# Patient Record
Sex: Male | Born: 1945
Health system: Southern US, Community
[De-identification: ages and names within clinical notes are randomized; demographics above are authoritative.]

## PROBLEM LIST (undated history)

## (undated) DIAGNOSIS — I10 Essential (primary) hypertension: Secondary | ICD-10-CM

## (undated) DIAGNOSIS — G709 Myoneural disorder, unspecified: Secondary | ICD-10-CM

## (undated) DIAGNOSIS — Z85850 Personal history of malignant neoplasm of thyroid: Secondary | ICD-10-CM

## (undated) DIAGNOSIS — C801 Malignant (primary) neoplasm, unspecified: Secondary | ICD-10-CM

## (undated) HISTORY — PX: VASECTOMY: SHX75

## (undated) HISTORY — PX: TOTAL THYROIDECTOMY: SHX2547

## (undated) HISTORY — PX: BRAIN SURGERY: SHX531

---

## 1999-06-16 ENCOUNTER — Ambulatory Visit (HOSPITAL_COMMUNITY): Admission: RE | Admit: 1999-06-16 | Discharge: 1999-06-16 | Payer: Self-pay | Admitting: Gastroenterology

## 1999-07-12 ENCOUNTER — Encounter: Admission: RE | Admit: 1999-07-12 | Discharge: 1999-07-12 | Payer: Self-pay | Admitting: Family Medicine

## 1999-07-12 ENCOUNTER — Encounter: Payer: Self-pay | Admitting: Family Medicine

## 2000-09-29 ENCOUNTER — Emergency Department (HOSPITAL_COMMUNITY): Admission: EM | Admit: 2000-09-29 | Discharge: 2000-09-29 | Payer: Self-pay | Admitting: Emergency Medicine

## 2000-09-29 ENCOUNTER — Encounter: Payer: Self-pay | Admitting: Emergency Medicine

## 2001-05-22 ENCOUNTER — Emergency Department (HOSPITAL_COMMUNITY): Admission: EM | Admit: 2001-05-22 | Discharge: 2001-05-22 | Payer: Self-pay | Admitting: Emergency Medicine

## 2001-05-22 ENCOUNTER — Encounter: Payer: Self-pay | Admitting: Emergency Medicine

## 2001-06-08 ENCOUNTER — Emergency Department (HOSPITAL_COMMUNITY): Admission: EM | Admit: 2001-06-08 | Discharge: 2001-06-08 | Payer: Self-pay | Admitting: Emergency Medicine

## 2005-06-21 ENCOUNTER — Encounter: Admission: RE | Admit: 2005-06-21 | Discharge: 2005-06-21 | Payer: Self-pay | Admitting: Family Medicine

## 2005-06-21 ENCOUNTER — Other Ambulatory Visit: Admission: RE | Admit: 2005-06-21 | Discharge: 2005-06-21 | Payer: Self-pay | Admitting: Interventional Radiology

## 2005-06-21 ENCOUNTER — Encounter (INDEPENDENT_AMBULATORY_CARE_PROVIDER_SITE_OTHER): Payer: Self-pay | Admitting: *Deleted

## 2005-08-03 ENCOUNTER — Ambulatory Visit (HOSPITAL_COMMUNITY): Admission: RE | Admit: 2005-08-03 | Discharge: 2005-08-04 | Payer: Self-pay | Admitting: Surgery

## 2005-08-03 ENCOUNTER — Encounter (INDEPENDENT_AMBULATORY_CARE_PROVIDER_SITE_OTHER): Payer: Self-pay | Admitting: Specialist

## 2005-09-01 ENCOUNTER — Encounter: Admission: RE | Admit: 2005-09-01 | Discharge: 2005-09-01 | Payer: Self-pay | Admitting: Endocrinology

## 2005-09-08 ENCOUNTER — Encounter: Admission: RE | Admit: 2005-09-08 | Discharge: 2005-09-08 | Payer: Self-pay | Admitting: Endocrinology

## 2005-09-15 ENCOUNTER — Encounter: Admission: RE | Admit: 2005-09-15 | Discharge: 2005-09-15 | Payer: Self-pay | Admitting: Endocrinology

## 2006-08-29 ENCOUNTER — Encounter: Admission: RE | Admit: 2006-08-29 | Discharge: 2006-08-29 | Payer: Self-pay | Admitting: Endocrinology

## 2007-03-20 ENCOUNTER — Encounter: Admission: RE | Admit: 2007-03-20 | Discharge: 2007-03-20 | Payer: Self-pay | Admitting: Neurosurgery

## 2007-04-01 ENCOUNTER — Inpatient Hospital Stay (HOSPITAL_COMMUNITY): Admission: RE | Admit: 2007-04-01 | Discharge: 2007-04-05 | Payer: Self-pay | Admitting: Neurosurgery

## 2007-10-07 ENCOUNTER — Encounter: Admission: RE | Admit: 2007-10-07 | Discharge: 2007-10-07 | Payer: Self-pay | Admitting: Endocrinology

## 2007-10-08 ENCOUNTER — Encounter: Admission: RE | Admit: 2007-10-08 | Discharge: 2007-10-08 | Payer: Self-pay | Admitting: Endocrinology

## 2007-10-09 ENCOUNTER — Encounter (HOSPITAL_COMMUNITY): Admission: RE | Admit: 2007-10-09 | Discharge: 2007-11-14 | Payer: Self-pay | Admitting: Endocrinology

## 2008-09-21 ENCOUNTER — Encounter (HOSPITAL_COMMUNITY): Admission: RE | Admit: 2008-09-21 | Discharge: 2008-11-19 | Payer: Self-pay | Admitting: Endocrinology

## 2009-07-15 ENCOUNTER — Ambulatory Visit (HOSPITAL_COMMUNITY): Admission: RE | Admit: 2009-07-15 | Discharge: 2009-07-15 | Payer: Self-pay | Admitting: Gastroenterology

## 2010-01-03 ENCOUNTER — Encounter (HOSPITAL_COMMUNITY)
Admission: RE | Admit: 2010-01-03 | Discharge: 2010-02-18 | Payer: Self-pay | Source: Home / Self Care | Attending: Endocrinology | Admitting: Endocrinology

## 2010-01-15 ENCOUNTER — Emergency Department (HOSPITAL_COMMUNITY)
Admission: EM | Admit: 2010-01-15 | Discharge: 2010-01-15 | Payer: Self-pay | Source: Home / Self Care | Admitting: Emergency Medicine

## 2010-03-13 ENCOUNTER — Encounter: Payer: Self-pay | Admitting: Endocrinology

## 2010-03-14 ENCOUNTER — Encounter: Payer: Self-pay | Admitting: Endocrinology

## 2010-07-05 NOTE — Discharge Summary (Signed)
NAME:  Jose Gross, [place] NO.:  [id number]   MEDICAL RECORD NO.:  [id number]          PATIENT TYPE:  INP   LOCATION:  3005                         FACILITY:  [facility]   PHYSICIAN:  Hewitt Shorts, M.D.DATE OF BIRTH:  06-05-1945   DATE OF ADMISSION:  04/01/2007  DATE OF DISCHARGE:  04/05/2007                               DISCHARGE SUMMARY   ADMISSION HISTORY:  The patient is a 65 year old man who presented with  left V1 trigeminal neuralgia.  He had had difficulties for 10 years, but  they had worsened recently and he was having persistent difficulties  with disabling facial pain despite maximum doses of gabapentin and  Tegretol which was leading to somnolence and a decision was made for  microvascular decompression.   PHYSICAL EXAMINATION:  General examination was unremarkable.  Neurologic  examination was intact.   HOSPITAL COURSE:  The patient was admitted and underwent  a left  retromastoid craniectomy and microvascular decompression of the  trigeminal nerve.  We found that an artery on the ventral surface of the  trigeminal nerve root entry zone compressing and pulsating against the  nerve.  We were able to free the artery up by opening up its arachnoid  adhesions and mobilizing it further anteriorly and placing Teflon felt  between the nerve and artery.  Surgery itself went quite well.   Postoperatively in the ICU, we tapered his Tegretol and Neurontin.  He  had no facial pain postoperatively with complete relief of the  trigeminal neuralgia.  His incision has healed nicely.  He has been  afebrile.  He had some mild nausea for the first day or two which is  eased.  He has some mild anorexia but no nausea at this time.  He has  mild incisional discomfort but no facial pain.  His wound is healing  nicely.  He is afebrile.  He is up and ambulating in the hall, and he  feels that he is ready for discharge.   He was treated with Decadron perioperatively and we  have tapered the  Decadron and we are discharging him to home with a prescription for low-  dose Decadron.  We have prescribed 0.75 mg tablets.  He is to take 1  b.i.d. for 2 days, then 1 daily for 2 days, and then 1 every other day  for 2 doses.  We prescribed 8 tablets and no refills.  The patient  declined a prescription for pain medication.  He feels that he will be  able to get by with just Tylenol.  I did tell him and his wife that they  could use Advil  or Aleve if needed.  He is to return in 3 days for  staple removal and to they are to contact us if there is any difficulty  that were to develop.   DISCHARGE DIAGNOSIS:  Trigeminal neuralgia.      Hewitt Shorts, M.D.  Electronically Signed     RWN/MEDQ  D:  04/05/2007  T:  04/05/2007  Job:  [id number]

## 2010-07-05 NOTE — Op Note (Signed)
NAME:  Jose Gross, Jose Gross NO.:  [id number]   MEDICAL RECORD NO.:  [id number]          PATIENT TYPE:  INP   LOCATION:  3111                         FACILITY:  [facility]   PHYSICIAN:  Hewitt Shorts, M.D.DATE OF BIRTH:  1945/12/31   DATE OF PROCEDURE:  04/01/2007  DATE OF DISCHARGE:                               OPERATIVE REPORT   PREOPERATIVE DIAGNOSIS:  Left V1 trigeminal neuralgia.   POSTOPERATIVE DIAGNOSIS:  Left V1 trigeminal neuralgia.   PROCEDURE PERFORMED:  Left retromastoid craniectomy and microvascular  decompression of the left trigeminal nerve with microdissection.   SURGEON:  Hewitt Shorts, MD.   ASSISTANT:  Leonel Ramsay   ANESTHESIA:  General endotracheal.   INDICATIONS:  The patient is a 65 year old man who is here with left V1  trigeminal neuralgia.  The patient treated for the past 10 years but has  worsening over the past couple of months requiring large amounts of two  medications with increasing side effects, particularly of drowsiness and  grogginess.  The patient was therefore considered for surgical  intervention.  He is admitted now for such.   PROCEDURE:  The patient was brought to the operating room and placed  under general endotracheal anesthesia.  The patient was placed on a  three-pin Mayfield headholder.  The patient was turned to three-quarter  park bench position.  The left retromastoid region was shaved and then  prepped with Betadine soap and solution and draped in sterile fashion.  An approximately 9 cm length incision was made behind the left ear.  The  midline incision was infiltrated with local anesthetic with epinephrine.  Incision was made and carried down to the subcutaneous tissue.  Bipolar  cautery and electrocautery used to maintain hemostasis.  Dissection was  carried down to the skull and the nuchal muscles were elevated off of  the occipital bone.  A self-retaining retractor was placed.  Bridging  emissary  veins between the skull and the scalp was packed with either  Gelfoam or wax as needed, and we exposed the left retromastoid region.  Then, with loop magnification, craniectomy was performed using the XMax  drill and Kerrison punches.  The dura was identified and we gradually  proceeded with the craniectomy in a rostral and lateral trajectory until  we reached the transverse sinus superiorly and the genu of the sinus  into the sigmoid sinus laterally.  The edges of the bone were waxed as  needed.  We had moderate bleeding from large venous structure within the  bone in the retromastoid area, and this was waxed and packed with  Gelfoam as needed, and good hemostasis was established.  We then opened  the dura in a triangular fashion with flaps hinged towards the  transverse sinuses superiorly and the sigmoid sinus laterally.  The  arachnoid was opened and CSF drainage was allowed to occur.  Good CSF  drainage was encountered and as the CSF drained, the cerebellum began to  fall away and we were able to set up the Apfelbaum retractor and we were  able to dissect along the posterior fossa  corner formed by the tentorium  and the lateral wall of the posterior fossa.  The microscope was draped  and brought in the field to provide additional magnification,  illumination, and visualization.  The remainder of the dissection was  performed using a microdissection and microsurgical technique.  There  was a large multi-vessel petrosal vein.  There were at least 4 or 5  branches, each of which were coagulated and divided, and the cerebellum  was then able to further fall away.  We continued the dissection  anteriorly.  The trigeminal nerve was identified.  The VII and VIII  nerve were seen caudal to this area.  We were able to do microdissection  and micro scissors opened the arachnoid around the trigeminal nerve.  We  found an artery on the ventral surfaces of the nerve against the  brainstem and could  observe the nerve pulsating with the arterial  pulsations.  We then began to free the arachnoid up around this artery.  It was mobilized anteriorly and then we were able to take Teflon felt  and place it between the artery and the nerve on the anterior (ventral)  surface of the nerve.  Good separation of the artery and nerve was  achieved.  The Teflon felt was felt to be securely positioned.  The  intracranial cavity was irrigated with saline solution.  Checked for  hemostasis which was confirmed and then we proceeded  with closure.  The  self-retaining retractor was removed.  The cerebellum was pulsated  nicely.  The dura which had been packed up to the sides with Nurolon  sutures was freed and partial dural closure was performed with 4-0  Nurolon sutures but  there remained a small gap.  This was patched with  a piece of Dura-Guard.  We did place a single HemoClip near the apex of  the opening near the genu of the junction between the transverse and  sigmoid sinus.  The Dura-Guard was sutured to the edge of the dura with  interrupted 4-0 Nurolon suture.  Good watertight closure was achieved  and then Gelfoam was placed in the craniectomy defect and then the  nuchal muscles were approximated with interrupted undyed 0 Vicryl  sutures.  The fascia was closed with interrupted undyed 1 Vicryl suture  and the subcutaneous and subcuticular layers closed with interrupted  undyed 2-0 Vicryl suture and the skin was closed with surgical staples.  The wound was dressed with Adaptic, sterile gauze, and Hypafix.  The  procedure was tolerated well.  Estimated blood loss was 600 mL.  Sponge  and needle counts were correct. Following surgery, the patient was  turned back to supine position.  The three-pin Mayfield head holder was  removed and he is being reversed from the anesthetic to be extubated and  transferred to the recovery room for further care.      Hewitt Shorts, M.D.  Electronically  Signed     RWN/MEDQ  D:  04/01/2007  T:  04/02/2007  Job:  [id number]

## 2010-07-08 NOTE — Op Note (Signed)
NAME:  Jose Gross, Jose Gross                ACCOUNT NO.:  [id number]   MEDICAL RECORD NO.:  [id number]          PATIENT TYPE:  OIB   LOCATION:  1319                         FACILITY:  [facility]   PHYSICIAN:  Velora Heckler, MD      DATE OF BIRTH:  1945/04/26   DATE OF PROCEDURE:  08/03/2005  DATE OF DISCHARGE:                                 OPERATIVE REPORT   PREOP DIAGNOSIS:  Papillary thyroid carcinoma.   POSTOPERATIVE DIAGNOSIS:  Papillary thyroid carcinoma.   PROCEDURE:  Total thyroidectomy.   SURGEON:  Velora Heckler, MD, FACS   ASSISTANT:  RNFA   ANESTHESIA:  General.   ESTIMATED BLOOD LOSS:  Minimal.   PREPARATION:  Betadine.   COMPLICATIONS:  None.   INDICATIONS:  The patient is a pleasant 65 year old white male from  [place], [place] referred by Dr. Blair Heys with newly  diagnosed papillary thyroid carcinoma.  The patient had been seen by his  ophthalmologist in March 2007.  Ophthalmologist had noted some retinal  changes and requested a carotid duplex examination which was performed.  An  incidental finding during this exam was left thyroid nodule.  The patient  was then scheduled for thyroid ultrasound followed by fine-needle aspiration  and biopsy.  Cytopathology from Jun 21, 2005 demonstrated findings consistent  with papillary thyroid carcinoma.  The patient was referred to surgery for  thyroidectomy.   BODY OF REPORT:  Procedure is done in OR #11 at the [facility].  The patient is brought to the operating room, and placed in the  supine position on the operating room table.  Following administration of  general anesthesia, the patient is positioned and then prepped and draped in  usual strict aseptic fashion.  After ascertaining that an adequate level of  anesthesia had been obtained, a Kocher incision is made with a #15 blade.  Dissection was carried through subcutaneous tissues and platysma.  Hemostasis was obtained with the  electrocautery.  Skin flaps are elevated  cephalad and caudad from the thyroid notch to the sternal notch.  A Mahorner  self-retaining retractor is placed for exposure.   Strap muscles were incised in the midline; and dissection was begun on the  left side of the neck.  Strap muscles are reflected laterally.  Left thyroid  lobe was exposed.  Middle thyroid vein is divided between medium Ligaclips.  Gland is mobilized with gentle blunt dissection with the Pension scheme manager.  Inferior venous tributaries are ligated in continuity with 2-0 silk ties and  divided.  The superior pole vessels are dissected out.  There were ligated  in continuity with 2-0 silk ties and medium Ligaclips and divided.  The  gland is rolled anteriorly.  Branches of the inferior thyroid artery are  identified and divided between small and medium Ligaclips.  Recurrent  laryngeal nerve is identified and preserved.  Parathyroid tissue is  identified and preserved.  Gland is gently mobilized medially.  Dissection  is carried down onto the trachea.  The ligament of Allyson Sabal is transected with  the electrocautery taking care to  avoid the nerve.  Gland is mobilized  across the midline.  There is no significant pyramidal lobe visible.   Next, we turned our attention to the right side.  Again, strap muscles are  reflected laterally.  The middle thyroid vein is divided between medium  Ligaclips.  Superior pole is dissected out; and superior pole vessels  ligated in continuity with 2-0 silk ties and medium Ligaclips and divided.  Gland is rolled anteriorly.  Parathyroid tissue is identified and preserved.  Inferior venous tributaries are divided between medium Ligaclips.  Branches  of the inferior thyroid artery are divided between small Ligaclips.  Gland  is rolled further anteriorly.  Recurrent nerve is identified and preserved.  Ligament of Allyson Sabal is transected with the electrocautery; and the gland is  rolled up and onto the  anterior trachea from which it is completely excised  with the electrocautery.   Tissue in the central compartment inferior to the isthmus is also dissected  out and included en bloc with the thyroid specimen.  Good hemostasis is  achieved.  The entire specimen is resected.  Suture is used to mark the  right superior pole.  Neck is irrigated with warm saline and good hemostasis  achieved throughout.  Surgicel is placed over the recurrent nerve and  parathyroid glands bilaterally.  Strap muscles were reapproximated in the  midline with interrupted 3-0 Vicryl sutures.  Platysma is closed with  interrupted 3-0 Vicryl sutures.  Skin is closed with a running 4-0 Vicryl  subcuticular suture.  Wound is washed and dried and Benzoin and Steri-Strips  are applied.  Sterile dressings are applied.  The patient is awakened from  anesthesia and brought to the recovery room in stable condition.  The  patient tolerated the procedure well.      Velora Heckler, MD  Electronically Signed     TMG/MEDQ  D:  08/03/2005  T:  08/03/2005  Job:  [id number]   cc:   Bryan Lemma. Manus Gunning, M.D.  Fax: [phone]   Dorisann Frames, M.D.  Fax: [phone]

## 2010-11-11 LAB — TYPE AND SCREEN
ABO/RH(D): O POS
Antibody Screen: NEGATIVE

## 2010-11-11 LAB — CBC
HCT: 33.5 — ABNORMAL LOW
HCT: 40.8
Hemoglobin: 11.6 — ABNORMAL LOW
Hemoglobin: 14
MCHC: 34.4
MCHC: 34.7
MCV: 90.7
MCV: 91.7
Platelets: 240
Platelets: 270
RBC: 3.66 — ABNORMAL LOW
RBC: 4.5
RDW: 13.5
RDW: 13.7
WBC: 11.1 — ABNORMAL HIGH
WBC: 5.4

## 2010-11-11 LAB — DIFFERENTIAL
Basophils Absolute: 0
Basophils Relative: 0
Eosinophils Absolute: 0.1
Eosinophils Relative: 2
Lymphocytes Relative: 31
Lymphs Abs: 1.7
Monocytes Absolute: 0.5
Monocytes Relative: 9
Neutro Abs: 3.1
Neutrophils Relative %: 58

## 2010-11-11 LAB — BASIC METABOLIC PANEL
BUN: 10
CO2: 26
Calcium: 7.8 — ABNORMAL LOW
Chloride: 107
Creatinine, Ser: 1.02
GFR calc Af Amer: >60
GFR calc non Af Amer: >60
Glucose, Bld: 162 — ABNORMAL HIGH
Potassium: 4
Sodium: 140

## 2010-11-11 LAB — ABO/RH: ABO/RH(D): O POS
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
## 2012-08-09 ENCOUNTER — Other Ambulatory Visit: Payer: Self-pay | Admitting: Geriatric Medicine

## 2012-12-19 ENCOUNTER — Other Ambulatory Visit (HOSPITAL_COMMUNITY): Payer: Self-pay | Admitting: Endocrinology

## 2012-12-19 DIAGNOSIS — C73 Malignant neoplasm of thyroid gland: Secondary | ICD-10-CM

## 2012-12-30 ENCOUNTER — Encounter (HOSPITAL_COMMUNITY)
Admission: RE | Admit: 2012-12-30 | Discharge: 2012-12-30 | Disposition: A | Discharge status: Home / Self Care | Payer: Medicare Other | Source: Ambulatory Visit | Attending: Endocrinology | Admitting: Endocrinology

## 2012-12-31 ENCOUNTER — Encounter (HOSPITAL_COMMUNITY): Payer: Self-pay

## 2013-01-01 ENCOUNTER — Encounter (HOSPITAL_COMMUNITY): Payer: Self-pay

## 2013-01-03 ENCOUNTER — Encounter (HOSPITAL_COMMUNITY): Payer: Self-pay

## 2013-01-06 ENCOUNTER — Encounter (HOSPITAL_COMMUNITY)
Admission: RE | Admit: 2013-01-06 | Discharge: 2013-01-06 | Disposition: A | Discharge status: Home / Self Care | Payer: Medicare Other | Source: Ambulatory Visit | Attending: Endocrinology | Admitting: Endocrinology

## 2013-01-06 DIAGNOSIS — C73 Malignant neoplasm of thyroid gland: Secondary | ICD-10-CM

## 2013-01-06 MED ORDER — THYROTROPIN ALFA 1.1 MG IM SOLR
0.9000 mg | INTRAMUSCULAR | Status: AC
  Administered 2013-01-06: 0.9 mg via INTRAMUSCULAR

## 2013-01-07 ENCOUNTER — Encounter (HOSPITAL_COMMUNITY)
Admission: RE | Admit: 2013-01-07 | Discharge: 2013-01-07 | Disposition: A | Discharge status: Home / Self Care | Payer: Medicare Other | Source: Ambulatory Visit | Attending: Endocrinology | Admitting: Endocrinology

## 2013-01-07 DIAGNOSIS — C73 Malignant neoplasm of thyroid gland: Secondary | ICD-10-CM | POA: Insufficient documentation

## 2013-01-07 MED ORDER — THYROTROPIN ALFA 1.1 MG IM SOLR
0.9000 mg | INTRAMUSCULAR | Status: AC
  Administered 2013-01-07: 0.9 mg via INTRAMUSCULAR

## 2013-01-08 ENCOUNTER — Encounter (HOSPITAL_COMMUNITY)
Admission: RE | Admit: 2013-01-08 | Discharge: 2013-01-08 | Disposition: A | Discharge status: Home / Self Care | Payer: Medicare Other | Source: Ambulatory Visit | Attending: Endocrinology | Admitting: Endocrinology

## 2013-01-08 DIAGNOSIS — C73 Malignant neoplasm of thyroid gland: Secondary | ICD-10-CM | POA: Insufficient documentation

## 2013-01-08 MED ORDER — SODIUM IODIDE I 131 CAPSULE
4.0000 | Freq: Once | INTRAVENOUS | Status: AC | PRN
  Administered 2013-01-08: 4 via ORAL

## 2013-01-10 ENCOUNTER — Encounter (HOSPITAL_COMMUNITY)
Admission: RE | Admit: 2013-01-10 | Discharge: 2013-01-10 | Disposition: A | Discharge status: Home / Self Care | Payer: Medicare Other | Source: Ambulatory Visit | Attending: Endocrinology | Admitting: Endocrinology

## 2013-01-10 DIAGNOSIS — C73 Malignant neoplasm of thyroid gland: Secondary | ICD-10-CM | POA: Insufficient documentation

## 2015-01-06 ENCOUNTER — Other Ambulatory Visit: Payer: Self-pay | Admitting: Gastroenterology

## 2015-01-07 ENCOUNTER — Encounter (HOSPITAL_COMMUNITY): Payer: Self-pay | Admitting: *Deleted

## 2015-01-10 NOTE — Anesthesia Preprocedure Evaluation (Addendum)
Anesthesia Evaluation  Patient identified by MRN, date of birth, ID band Patient awake    Reviewed: Allergy & Precautions, H&P , NPO status , Patient's Chart, lab work & pertinent test results  Airway Mallampati: III  TM Distance: >3 FB Neck ROM: Full    Dental no notable dental hx. (+) Teeth Intact, Dental Advisory Given   Pulmonary neg pulmonary ROS, former smoker,    Pulmonary exam normal breath sounds clear to auscultation       Cardiovascular hypertension, Pt. on medications  Rhythm:Regular Rate:Normal     Neuro/Psych negative neurological ROS  negative psych ROS   GI/Hepatic negative GI ROS, Neg liver ROS,   Endo/Other  Hypothyroidism   Renal/GU negative Renal ROS  negative genitourinary   Musculoskeletal   Abdominal   Peds  Hematology negative hematology ROS (+)   Anesthesia Other Findings   Reproductive/Obstetrics negative OB ROS                           Anesthesia Physical Anesthesia Plan  ASA: II  Anesthesia Plan: MAC   Post-op Pain Management:    Induction: Intravenous  Airway Management Planned: Simple Face Mask  Additional Equipment:   Intra-op Plan:   Post-operative Plan:   Informed Consent: I have reviewed the patients History and Physical, chart, labs and discussed the procedure including the risks, benefits and alternatives for the proposed anesthesia with the patient or authorized representative who has indicated his/her understanding and acceptance.   Dental advisory given  Plan Discussed with: CRNA  Anesthesia Plan Comments:         Anesthesia Quick Evaluation

## 2015-01-11 ENCOUNTER — Ambulatory Visit (HOSPITAL_COMMUNITY)
Admission: RE | Admit: 2015-01-11 | Discharge: 2015-01-11 | Disposition: A | Discharge status: Home / Self Care | Payer: Medicare HMO | Source: Ambulatory Visit | Attending: Gastroenterology | Admitting: Gastroenterology

## 2015-01-11 ENCOUNTER — Ambulatory Visit (HOSPITAL_COMMUNITY): Payer: Medicare HMO | Admitting: Anesthesiology

## 2015-01-11 ENCOUNTER — Encounter (HOSPITAL_COMMUNITY): Payer: Self-pay | Admitting: *Deleted

## 2015-01-11 ENCOUNTER — Encounter (HOSPITAL_COMMUNITY)
Admission: RE | Disposition: A | Discharge status: Home / Self Care | Payer: Self-pay | Source: Ambulatory Visit | Attending: Gastroenterology

## 2015-01-11 DIAGNOSIS — E7800 Pure hypercholesterolemia, unspecified: Secondary | ICD-10-CM | POA: Insufficient documentation

## 2015-01-11 DIAGNOSIS — K5730 Diverticulosis of large intestine without perforation or abscess without bleeding: Secondary | ICD-10-CM | POA: Insufficient documentation

## 2015-01-11 DIAGNOSIS — E039 Hypothyroidism, unspecified: Secondary | ICD-10-CM | POA: Diagnosis not present

## 2015-01-11 DIAGNOSIS — K635 Polyp of colon: Secondary | ICD-10-CM | POA: Diagnosis not present

## 2015-01-11 DIAGNOSIS — Z800 Family history of malignant neoplasm of digestive organs: Secondary | ICD-10-CM | POA: Insufficient documentation

## 2015-01-11 DIAGNOSIS — K5790 Diverticulosis of intestine, part unspecified, without perforation or abscess without bleeding: Secondary | ICD-10-CM | POA: Diagnosis not present

## 2015-01-11 DIAGNOSIS — Z1211 Encounter for screening for malignant neoplasm of colon: Secondary | ICD-10-CM | POA: Diagnosis not present

## 2015-01-11 DIAGNOSIS — Z87891 Personal history of nicotine dependence: Secondary | ICD-10-CM | POA: Insufficient documentation

## 2015-01-11 DIAGNOSIS — D120 Benign neoplasm of cecum: Secondary | ICD-10-CM | POA: Insufficient documentation

## 2015-01-11 DIAGNOSIS — Z09 Encounter for follow-up examination after completed treatment for conditions other than malignant neoplasm: Secondary | ICD-10-CM | POA: Diagnosis present

## 2015-01-11 DIAGNOSIS — D122 Benign neoplasm of ascending colon: Secondary | ICD-10-CM | POA: Diagnosis not present

## 2015-01-11 DIAGNOSIS — I10 Essential (primary) hypertension: Secondary | ICD-10-CM | POA: Insufficient documentation

## 2015-01-11 DIAGNOSIS — Z86010 Personal history of colonic polyps: Secondary | ICD-10-CM | POA: Diagnosis not present

## 2015-01-11 HISTORY — PX: COLONOSCOPY WITH PROPOFOL: SHX5780

## 2015-01-11 HISTORY — DX: Malignant (primary) neoplasm, unspecified: C80.1

## 2015-01-11 HISTORY — DX: Myoneural disorder, unspecified: G70.9

## 2015-01-11 HISTORY — DX: Essential (primary) hypertension: I10

## 2015-01-11 HISTORY — DX: Personal history of malignant neoplasm of thyroid: Z85.850

## 2015-01-11 SURGERY — COLONOSCOPY WITH PROPOFOL
Anesthesia: Monitor Anesthesia Care

## 2015-01-11 MED ORDER — LACTATED RINGERS IV SOLN
INTRAVENOUS | Status: DC
  Administered 2015-01-11: 1000 mL via INTRAVENOUS

## 2015-01-11 MED ORDER — PROPOFOL 10 MG/ML IV BOLUS
INTRAVENOUS | Status: DC | PRN
  Administered 2015-01-11: 50 mg via INTRAVENOUS
  Administered 2015-01-11 (×4): 25 mg via INTRAVENOUS
  Administered 2015-01-11: 50 mg via INTRAVENOUS
  Administered 2015-01-11: 100 mg via INTRAVENOUS
  Administered 2015-01-11 (×2): 50 mg via INTRAVENOUS

## 2015-01-11 MED ORDER — SODIUM CHLORIDE 0.9 % IV SOLN: INTRAVENOUS | Status: DC

## 2015-01-11 MED ORDER — PROPOFOL 10 MG/ML IV BOLUS
INTRAVENOUS | Status: AC
  Filled 2015-01-11: qty 20

## 2015-01-11 SURGICAL SUPPLY — 22 items

## 2015-01-11 NOTE — H&P (Signed)
  Procedure: Surveillance colonoscopy. Mother diagnosed with colon cancer at age 68. Normal screening colonoscopy performed on 06/16/1999. Small adenomatous colon polyps removed colonoscopically in 2006 and 2011  History: The patient is a 69 year old male born 1946/01/23. He is scheduled to undergo a surveillance colonoscopy today.  Past medical history: Thyroid nodule. Hypertension. Hypercholesterolemia. Hypothyroidism. Brain surgery. Thyroidectomy. History of adenomatous colon polyps removed colonoscopically.  Family history: Mother diagnosed with colon cancer.  Medication allergies: None  Exam: The patient is alert and lying comfortably on the endoscopy stretcher. Abdomen is soft and nontender to palpation. Lungs are clear to auscultation. Cardiac exam reveals a regular rhythm.  Plan: Proceed with surveillance colonoscopy

## 2015-01-11 NOTE — Discharge Instructions (Signed)

## 2015-01-11 NOTE — Transfer of Care (Signed)
Immediate Anesthesia Transfer of Care Note  Patient: Jose Gross  Procedure(s) Performed: Procedure(s): COLONOSCOPY WITH PROPOFOL (N/A)  Patient Location: PACU  Anesthesia Type:MAC  Level of Consciousness: awake, alert  and oriented  Airway & Oxygen Therapy: Patient Spontanous Breathing and Patient connected to face mask oxygen  Post-op Assessment: Report given to RN and Post -op Vital signs reviewed and stable  Post vital signs: Reviewed and stable  Last Vitals:  Filed Vitals:   01/11/15 0725  BP: 178/90  Pulse: 77  Temp: 36.8 C  Resp: 20    Complications: No apparent anesthesia complications

## 2015-01-11 NOTE — Anesthesia Postprocedure Evaluation (Signed)
Anesthesia Post Note  Patient: OSBOURNE HOLTZCLAW  Procedure(s) Performed: Procedure(s) (LRB): COLONOSCOPY WITH PROPOFOL (N/A)  Patient location during evaluation: PACU Anesthesia Type: MAC Level of consciousness: awake and alert Pain management: pain level controlled Vital Signs Assessment: post-procedure vital signs reviewed and stable Respiratory status: spontaneous breathing Cardiovascular status: blood pressure returned to baseline Postop Assessment: No signs of nausea or vomiting Anesthetic complications: no    Last Vitals:  Filed Vitals:   01/11/15 0930 01/11/15 0939  BP: 137/84 142/85  Pulse: 72 68  Temp:    Resp: 17 11    Last Pain: There were no vitals filed for this visit.               Ardra Kuznicki,W. EDMOND

## 2015-01-11 NOTE — Op Note (Signed)
Procedure: Surveillance colonoscopy. Mother diagnosed with colon cancer at age 68. 06/16/1999 normal screening colonoscopy performed. Small adenomatous colon polyps removed colonoscopically in 2006 and in 2011  Endoscopist: Earle Gell  Premedication: Propofol administered by anesthesia  Procedure: The patient was placed in the left lateral decubitus position. Anal inspection and digital rectal exam were normal. The Pentax pediatric colonoscope was introduced into the rectum and advanced to the cecum. A normal-appearing ileocecal valve and appendiceal orifice were identified. Colonic preparation for the exam today was good. Withdrawal time was 26 minutes.  Rectum. Normal. Retroflexed view of the distal rectum was normal  Sigmoid colon and descending colon. Colonic diverticulosis  Splenic flexure. Normal  Transverse colon. Normal  Hepatic flexure. Normal  Ascending colon. Two 3-4 millimeters sized sessile polyps were removed from the distal ascending colon with the cold biopsy forceps  Cecum and ileocecal valve. A 3 mm sessile polyp was removed with the cold biopsy forceps and a 5 mm sessile polyp was removed with the cold snare and cold biopsy forceps  Assessment: Two small polyps were removed from the cecum and two small polyps were removed from the ascending colon. Otherwise normal colonoscopy.  Recommendation: Schedule surveillance colonoscopy in 5 years

## 2015-01-12 ENCOUNTER — Encounter (HOSPITAL_COMMUNITY): Payer: Self-pay | Admitting: Gastroenterology

## 2015-02-03 DIAGNOSIS — I10 Essential (primary) hypertension: Secondary | ICD-10-CM | POA: Diagnosis not present

## 2015-02-03 DIAGNOSIS — E785 Hyperlipidemia, unspecified: Secondary | ICD-10-CM | POA: Diagnosis not present

## 2015-02-08 DIAGNOSIS — L814 Other melanin hyperpigmentation: Secondary | ICD-10-CM | POA: Diagnosis not present

## 2015-02-08 DIAGNOSIS — L570 Actinic keratosis: Secondary | ICD-10-CM | POA: Diagnosis not present

## 2015-03-12 DIAGNOSIS — H6123 Impacted cerumen, bilateral: Secondary | ICD-10-CM | POA: Diagnosis not present

## 2015-03-12 DIAGNOSIS — H9203 Otalgia, bilateral: Secondary | ICD-10-CM | POA: Diagnosis not present

## 2015-08-13 DIAGNOSIS — H52223 Regular astigmatism, bilateral: Secondary | ICD-10-CM | POA: Diagnosis not present

## 2015-08-13 DIAGNOSIS — H5203 Hypermetropia, bilateral: Secondary | ICD-10-CM | POA: Diagnosis not present

## 2015-08-13 DIAGNOSIS — H25819 Combined forms of age-related cataract, unspecified eye: Secondary | ICD-10-CM | POA: Diagnosis not present

## 2015-08-13 DIAGNOSIS — H43399 Other vitreous opacities, unspecified eye: Secondary | ICD-10-CM | POA: Diagnosis not present

## 2015-10-07 DIAGNOSIS — E890 Postprocedural hypothyroidism: Secondary | ICD-10-CM | POA: Diagnosis not present

## 2015-10-07 DIAGNOSIS — C73 Malignant neoplasm of thyroid gland: Secondary | ICD-10-CM | POA: Diagnosis not present

## 2015-10-14 DIAGNOSIS — E890 Postprocedural hypothyroidism: Secondary | ICD-10-CM | POA: Diagnosis not present

## 2015-10-14 DIAGNOSIS — C73 Malignant neoplasm of thyroid gland: Secondary | ICD-10-CM | POA: Diagnosis not present

## 2015-11-02 DIAGNOSIS — N390 Urinary tract infection, site not specified: Secondary | ICD-10-CM | POA: Diagnosis not present

## 2015-11-02 DIAGNOSIS — C73 Malignant neoplasm of thyroid gland: Secondary | ICD-10-CM | POA: Diagnosis not present

## 2015-11-02 DIAGNOSIS — I10 Essential (primary) hypertension: Secondary | ICD-10-CM | POA: Diagnosis not present

## 2015-11-02 DIAGNOSIS — R8299 Other abnormal findings in urine: Secondary | ICD-10-CM | POA: Diagnosis not present

## 2015-11-02 DIAGNOSIS — R739 Hyperglycemia, unspecified: Secondary | ICD-10-CM | POA: Diagnosis not present

## 2015-11-02 DIAGNOSIS — E784 Other hyperlipidemia: Secondary | ICD-10-CM | POA: Diagnosis not present

## 2015-11-02 DIAGNOSIS — Z125 Encounter for screening for malignant neoplasm of prostate: Secondary | ICD-10-CM | POA: Diagnosis not present

## 2015-11-09 DIAGNOSIS — Z8249 Family history of ischemic heart disease and other diseases of the circulatory system: Secondary | ICD-10-CM | POA: Diagnosis not present

## 2015-11-09 DIAGNOSIS — E784 Other hyperlipidemia: Secondary | ICD-10-CM | POA: Diagnosis not present

## 2015-11-09 DIAGNOSIS — Z800 Family history of malignant neoplasm of digestive organs: Secondary | ICD-10-CM | POA: Diagnosis not present

## 2015-11-09 DIAGNOSIS — I10 Essential (primary) hypertension: Secondary | ICD-10-CM | POA: Diagnosis not present

## 2015-11-09 DIAGNOSIS — J45909 Unspecified asthma, uncomplicated: Secondary | ICD-10-CM | POA: Diagnosis not present

## 2015-11-09 DIAGNOSIS — N390 Urinary tract infection, site not specified: Secondary | ICD-10-CM | POA: Diagnosis not present

## 2015-11-09 DIAGNOSIS — R739 Hyperglycemia, unspecified: Secondary | ICD-10-CM | POA: Diagnosis not present

## 2015-11-09 DIAGNOSIS — C73 Malignant neoplasm of thyroid gland: Secondary | ICD-10-CM | POA: Diagnosis not present

## 2015-11-09 DIAGNOSIS — Z0000 Encounter for general adult medical examination without abnormal findings: Secondary | ICD-10-CM | POA: Diagnosis not present

## 2015-11-09 DIAGNOSIS — Z6823 Body mass index (BMI) 23.0-23.9, adult: Secondary | ICD-10-CM | POA: Diagnosis not present

## 2015-11-11 DIAGNOSIS — Z1212 Encounter for screening for malignant neoplasm of rectum: Secondary | ICD-10-CM | POA: Diagnosis not present

## 2015-12-23 DIAGNOSIS — Z23 Encounter for immunization: Secondary | ICD-10-CM | POA: Diagnosis not present

## 2015-12-31 DIAGNOSIS — L570 Actinic keratosis: Secondary | ICD-10-CM | POA: Diagnosis not present

## 2015-12-31 DIAGNOSIS — L820 Inflamed seborrheic keratosis: Secondary | ICD-10-CM | POA: Diagnosis not present

## 2015-12-31 DIAGNOSIS — D225 Melanocytic nevi of trunk: Secondary | ICD-10-CM | POA: Diagnosis not present

## 2015-12-31 DIAGNOSIS — D2262 Melanocytic nevi of left upper limb, including shoulder: Secondary | ICD-10-CM | POA: Diagnosis not present

## 2015-12-31 DIAGNOSIS — L821 Other seborrheic keratosis: Secondary | ICD-10-CM | POA: Diagnosis not present

## 2015-12-31 DIAGNOSIS — D2261 Melanocytic nevi of right upper limb, including shoulder: Secondary | ICD-10-CM | POA: Diagnosis not present

## 2016-01-17 DIAGNOSIS — E890 Postprocedural hypothyroidism: Secondary | ICD-10-CM | POA: Diagnosis not present

## 2016-06-01 DIAGNOSIS — Z23 Encounter for immunization: Secondary | ICD-10-CM | POA: Diagnosis not present

## 2016-06-01 DIAGNOSIS — S41122A Laceration with foreign body of left upper arm, initial encounter: Secondary | ICD-10-CM | POA: Diagnosis not present

## 2016-06-01 DIAGNOSIS — S61210A Laceration without foreign body of right index finger without damage to nail, initial encounter: Secondary | ICD-10-CM | POA: Diagnosis not present

## 2016-06-17 DIAGNOSIS — J329 Chronic sinusitis, unspecified: Secondary | ICD-10-CM | POA: Diagnosis not present

## 2016-08-01 DIAGNOSIS — H25813 Combined forms of age-related cataract, bilateral: Secondary | ICD-10-CM | POA: Diagnosis not present

## 2016-08-01 DIAGNOSIS — H5203 Hypermetropia, bilateral: Secondary | ICD-10-CM | POA: Diagnosis not present

## 2016-08-01 DIAGNOSIS — H52223 Regular astigmatism, bilateral: Secondary | ICD-10-CM | POA: Diagnosis not present

## 2016-08-01 DIAGNOSIS — H43813 Vitreous degeneration, bilateral: Secondary | ICD-10-CM | POA: Diagnosis not present

## 2016-08-08 DIAGNOSIS — Y9301 Activity, walking, marching and hiking: Secondary | ICD-10-CM | POA: Diagnosis not present

## 2016-08-08 DIAGNOSIS — Z0100 Encounter for examination of eyes and vision without abnormal findings: Secondary | ICD-10-CM | POA: Diagnosis not present

## 2016-08-08 DIAGNOSIS — W19XXXA Unspecified fall, initial encounter: Secondary | ICD-10-CM | POA: Diagnosis not present

## 2016-11-18 DIAGNOSIS — Z23 Encounter for immunization: Secondary | ICD-10-CM | POA: Diagnosis not present

## 2016-11-29 DIAGNOSIS — R7309 Other abnormal glucose: Secondary | ICD-10-CM | POA: Diagnosis not present

## 2016-11-29 DIAGNOSIS — E7849 Other hyperlipidemia: Secondary | ICD-10-CM | POA: Diagnosis not present

## 2016-11-29 DIAGNOSIS — Z125 Encounter for screening for malignant neoplasm of prostate: Secondary | ICD-10-CM | POA: Diagnosis not present

## 2016-11-29 DIAGNOSIS — Z0000 Encounter for general adult medical examination without abnormal findings: Secondary | ICD-10-CM | POA: Diagnosis not present

## 2016-11-29 DIAGNOSIS — I10 Essential (primary) hypertension: Secondary | ICD-10-CM | POA: Diagnosis not present

## 2016-12-06 DIAGNOSIS — M26623 Arthralgia of bilateral temporomandibular joint: Secondary | ICD-10-CM | POA: Diagnosis not present

## 2016-12-06 DIAGNOSIS — J45998 Other asthma: Secondary | ICD-10-CM | POA: Diagnosis not present

## 2016-12-06 DIAGNOSIS — C73 Malignant neoplasm of thyroid gland: Secondary | ICD-10-CM | POA: Diagnosis not present

## 2016-12-06 DIAGNOSIS — H6123 Impacted cerumen, bilateral: Secondary | ICD-10-CM | POA: Diagnosis not present

## 2016-12-06 DIAGNOSIS — Z0000 Encounter for general adult medical examination without abnormal findings: Secondary | ICD-10-CM | POA: Diagnosis not present

## 2016-12-06 DIAGNOSIS — E7849 Other hyperlipidemia: Secondary | ICD-10-CM | POA: Diagnosis not present

## 2016-12-06 DIAGNOSIS — Z800 Family history of malignant neoplasm of digestive organs: Secondary | ICD-10-CM | POA: Diagnosis not present

## 2016-12-06 DIAGNOSIS — I10 Essential (primary) hypertension: Secondary | ICD-10-CM | POA: Diagnosis not present

## 2016-12-06 DIAGNOSIS — R7309 Other abnormal glucose: Secondary | ICD-10-CM | POA: Diagnosis not present

## 2016-12-06 DIAGNOSIS — Z8249 Family history of ischemic heart disease and other diseases of the circulatory system: Secondary | ICD-10-CM | POA: Diagnosis not present

## 2016-12-08 DIAGNOSIS — Z1212 Encounter for screening for malignant neoplasm of rectum: Secondary | ICD-10-CM | POA: Diagnosis not present

## 2017-03-16 DIAGNOSIS — D2261 Melanocytic nevi of right upper limb, including shoulder: Secondary | ICD-10-CM | POA: Diagnosis not present

## 2017-03-16 DIAGNOSIS — L821 Other seborrheic keratosis: Secondary | ICD-10-CM | POA: Diagnosis not present

## 2017-03-16 DIAGNOSIS — L720 Epidermal cyst: Secondary | ICD-10-CM | POA: Diagnosis not present

## 2017-03-16 DIAGNOSIS — D225 Melanocytic nevi of trunk: Secondary | ICD-10-CM | POA: Diagnosis not present

## 2017-03-16 DIAGNOSIS — L853 Xerosis cutis: Secondary | ICD-10-CM | POA: Diagnosis not present

## 2017-03-16 DIAGNOSIS — D224 Melanocytic nevi of scalp and neck: Secondary | ICD-10-CM | POA: Diagnosis not present

## 2017-08-30 DIAGNOSIS — H43819 Vitreous degeneration, unspecified eye: Secondary | ICD-10-CM | POA: Diagnosis not present

## 2017-08-30 DIAGNOSIS — H52223 Regular astigmatism, bilateral: Secondary | ICD-10-CM | POA: Diagnosis not present

## 2017-08-30 DIAGNOSIS — I10 Essential (primary) hypertension: Secondary | ICD-10-CM | POA: Diagnosis not present

## 2017-08-30 DIAGNOSIS — H5203 Hypermetropia, bilateral: Secondary | ICD-10-CM | POA: Diagnosis not present

## 2017-08-30 DIAGNOSIS — H35033 Hypertensive retinopathy, bilateral: Secondary | ICD-10-CM | POA: Diagnosis not present

## 2017-08-30 DIAGNOSIS — H524 Presbyopia: Secondary | ICD-10-CM | POA: Diagnosis not present

## 2017-08-30 DIAGNOSIS — H25813 Combined forms of age-related cataract, bilateral: Secondary | ICD-10-CM | POA: Diagnosis not present

## 2017-11-17 DIAGNOSIS — Z23 Encounter for immunization: Secondary | ICD-10-CM | POA: Diagnosis not present

## 2018-01-02 DIAGNOSIS — C73 Malignant neoplasm of thyroid gland: Secondary | ICD-10-CM | POA: Diagnosis not present

## 2018-01-02 DIAGNOSIS — R82998 Other abnormal findings in urine: Secondary | ICD-10-CM | POA: Diagnosis not present

## 2018-01-02 DIAGNOSIS — Z125 Encounter for screening for malignant neoplasm of prostate: Secondary | ICD-10-CM | POA: Diagnosis not present

## 2018-01-02 DIAGNOSIS — I10 Essential (primary) hypertension: Secondary | ICD-10-CM | POA: Diagnosis not present

## 2018-01-02 DIAGNOSIS — R7309 Other abnormal glucose: Secondary | ICD-10-CM | POA: Diagnosis not present

## 2018-01-02 DIAGNOSIS — E7849 Other hyperlipidemia: Secondary | ICD-10-CM | POA: Diagnosis not present

## 2018-01-04 DIAGNOSIS — Z1212 Encounter for screening for malignant neoplasm of rectum: Secondary | ICD-10-CM | POA: Diagnosis not present

## 2018-01-10 DIAGNOSIS — C73 Malignant neoplasm of thyroid gland: Secondary | ICD-10-CM | POA: Diagnosis not present

## 2018-01-10 DIAGNOSIS — Z8249 Family history of ischemic heart disease and other diseases of the circulatory system: Secondary | ICD-10-CM | POA: Diagnosis not present

## 2018-01-10 DIAGNOSIS — Z6825 Body mass index (BMI) 25.0-25.9, adult: Secondary | ICD-10-CM | POA: Diagnosis not present

## 2018-01-10 DIAGNOSIS — E7849 Other hyperlipidemia: Secondary | ICD-10-CM | POA: Diagnosis not present

## 2018-01-10 DIAGNOSIS — R7309 Other abnormal glucose: Secondary | ICD-10-CM | POA: Diagnosis not present

## 2018-01-10 DIAGNOSIS — J45909 Unspecified asthma, uncomplicated: Secondary | ICD-10-CM | POA: Diagnosis not present

## 2018-01-10 DIAGNOSIS — Z1389 Encounter for screening for other disorder: Secondary | ICD-10-CM | POA: Diagnosis not present

## 2018-01-10 DIAGNOSIS — Z800 Family history of malignant neoplasm of digestive organs: Secondary | ICD-10-CM | POA: Diagnosis not present

## 2018-01-10 DIAGNOSIS — Z0000 Encounter for general adult medical examination without abnormal findings: Secondary | ICD-10-CM | POA: Diagnosis not present

## 2018-01-10 DIAGNOSIS — I10 Essential (primary) hypertension: Secondary | ICD-10-CM | POA: Diagnosis not present

## 2018-04-17 DIAGNOSIS — C73 Malignant neoplasm of thyroid gland: Secondary | ICD-10-CM | POA: Diagnosis not present

## 2018-04-23 DIAGNOSIS — D2262 Melanocytic nevi of left upper limb, including shoulder: Secondary | ICD-10-CM | POA: Diagnosis not present

## 2018-04-23 DIAGNOSIS — D225 Melanocytic nevi of trunk: Secondary | ICD-10-CM | POA: Diagnosis not present

## 2018-04-23 DIAGNOSIS — L821 Other seborrheic keratosis: Secondary | ICD-10-CM | POA: Diagnosis not present

## 2018-04-23 DIAGNOSIS — D2261 Melanocytic nevi of right upper limb, including shoulder: Secondary | ICD-10-CM | POA: Diagnosis not present

## 2018-04-23 DIAGNOSIS — L853 Xerosis cutis: Secondary | ICD-10-CM | POA: Diagnosis not present

## 2018-09-03 DIAGNOSIS — H5203 Hypermetropia, bilateral: Secondary | ICD-10-CM | POA: Diagnosis not present

## 2018-09-03 DIAGNOSIS — H52223 Regular astigmatism, bilateral: Secondary | ICD-10-CM | POA: Diagnosis not present

## 2018-09-03 DIAGNOSIS — H25813 Combined forms of age-related cataract, bilateral: Secondary | ICD-10-CM | POA: Diagnosis not present

## 2018-09-27 DIAGNOSIS — Z0100 Encounter for examination of eyes and vision without abnormal findings: Secondary | ICD-10-CM | POA: Diagnosis not present

## 2018-11-07 DIAGNOSIS — Z23 Encounter for immunization: Secondary | ICD-10-CM | POA: Diagnosis not present

## 2019-01-24 DIAGNOSIS — E7849 Other hyperlipidemia: Secondary | ICD-10-CM | POA: Diagnosis not present

## 2019-01-24 DIAGNOSIS — Z125 Encounter for screening for malignant neoplasm of prostate: Secondary | ICD-10-CM | POA: Diagnosis not present

## 2019-01-27 DIAGNOSIS — R82998 Other abnormal findings in urine: Secondary | ICD-10-CM | POA: Diagnosis not present

## 2019-01-27 DIAGNOSIS — I10 Essential (primary) hypertension: Secondary | ICD-10-CM | POA: Diagnosis not present

## 2019-01-31 DIAGNOSIS — Z0000 Encounter for general adult medical examination without abnormal findings: Secondary | ICD-10-CM | POA: Diagnosis not present

## 2019-01-31 DIAGNOSIS — J45909 Unspecified asthma, uncomplicated: Secondary | ICD-10-CM | POA: Diagnosis not present

## 2019-01-31 DIAGNOSIS — Z800 Family history of malignant neoplasm of digestive organs: Secondary | ICD-10-CM | POA: Diagnosis not present

## 2019-01-31 DIAGNOSIS — E785 Hyperlipidemia, unspecified: Secondary | ICD-10-CM | POA: Diagnosis not present

## 2019-01-31 DIAGNOSIS — Z1339 Encounter for screening examination for other mental health and behavioral disorders: Secondary | ICD-10-CM | POA: Diagnosis not present

## 2019-01-31 DIAGNOSIS — Z8249 Family history of ischemic heart disease and other diseases of the circulatory system: Secondary | ICD-10-CM | POA: Diagnosis not present

## 2019-01-31 DIAGNOSIS — I10 Essential (primary) hypertension: Secondary | ICD-10-CM | POA: Diagnosis not present

## 2019-01-31 DIAGNOSIS — C73 Malignant neoplasm of thyroid gland: Secondary | ICD-10-CM | POA: Diagnosis not present

## 2019-01-31 DIAGNOSIS — Z1331 Encounter for screening for depression: Secondary | ICD-10-CM | POA: Diagnosis not present

## 2019-02-05 DIAGNOSIS — Z1212 Encounter for screening for malignant neoplasm of rectum: Secondary | ICD-10-CM | POA: Diagnosis not present

## 2019-04-03 ENCOUNTER — Ambulatory Visit: Payer: Medicare HMO

## 2019-06-16 DIAGNOSIS — D225 Melanocytic nevi of trunk: Secondary | ICD-10-CM | POA: Diagnosis not present

## 2019-06-16 DIAGNOSIS — L853 Xerosis cutis: Secondary | ICD-10-CM | POA: Diagnosis not present

## 2019-06-16 DIAGNOSIS — L570 Actinic keratosis: Secondary | ICD-10-CM | POA: Diagnosis not present

## 2019-06-16 DIAGNOSIS — D1801 Hemangioma of skin and subcutaneous tissue: Secondary | ICD-10-CM | POA: Diagnosis not present

## 2019-06-16 DIAGNOSIS — D2262 Melanocytic nevi of left upper limb, including shoulder: Secondary | ICD-10-CM | POA: Diagnosis not present

## 2019-06-16 DIAGNOSIS — L821 Other seborrheic keratosis: Secondary | ICD-10-CM | POA: Diagnosis not present

## 2019-09-09 DIAGNOSIS — H524 Presbyopia: Secondary | ICD-10-CM | POA: Diagnosis not present

## 2019-09-23 DIAGNOSIS — R05 Cough: Secondary | ICD-10-CM | POA: Diagnosis not present

## 2019-09-23 DIAGNOSIS — J069 Acute upper respiratory infection, unspecified: Secondary | ICD-10-CM | POA: Diagnosis not present

## 2019-09-23 DIAGNOSIS — I10 Essential (primary) hypertension: Secondary | ICD-10-CM | POA: Diagnosis not present

## 2019-09-23 DIAGNOSIS — R0981 Nasal congestion: Secondary | ICD-10-CM | POA: Diagnosis not present

## 2019-09-23 DIAGNOSIS — Z1152 Encounter for screening for COVID-19: Secondary | ICD-10-CM | POA: Diagnosis not present

## 2019-10-07 DIAGNOSIS — R69 Illness, unspecified: Secondary | ICD-10-CM | POA: Diagnosis not present

## 2019-10-20 DIAGNOSIS — R69 Illness, unspecified: Secondary | ICD-10-CM | POA: Diagnosis not present

## 2019-10-21 DIAGNOSIS — R69 Illness, unspecified: Secondary | ICD-10-CM | POA: Diagnosis not present

## 2019-10-29 DIAGNOSIS — R69 Illness, unspecified: Secondary | ICD-10-CM | POA: Diagnosis not present

## 2019-11-21 DIAGNOSIS — S0500XD Injury of conjunctiva and corneal abrasion without foreign body, unspecified eye, subsequent encounter: Secondary | ICD-10-CM | POA: Diagnosis not present

## 2019-11-21 DIAGNOSIS — T1511XA Foreign body in conjunctival sac, right eye, initial encounter: Secondary | ICD-10-CM | POA: Diagnosis not present

## 2019-11-27 DIAGNOSIS — H16142 Punctate keratitis, left eye: Secondary | ICD-10-CM | POA: Diagnosis not present

## 2019-11-27 DIAGNOSIS — H5203 Hypermetropia, bilateral: Secondary | ICD-10-CM | POA: Diagnosis not present

## 2019-11-27 DIAGNOSIS — H16102 Unspecified superficial keratitis, left eye: Secondary | ICD-10-CM | POA: Diagnosis not present

## 2019-11-27 DIAGNOSIS — H52223 Regular astigmatism, bilateral: Secondary | ICD-10-CM | POA: Diagnosis not present

## 2019-11-27 DIAGNOSIS — H524 Presbyopia: Secondary | ICD-10-CM | POA: Diagnosis not present

## 2020-02-24 DIAGNOSIS — Z125 Encounter for screening for malignant neoplasm of prostate: Secondary | ICD-10-CM | POA: Diagnosis not present

## 2020-02-24 DIAGNOSIS — E785 Hyperlipidemia, unspecified: Secondary | ICD-10-CM | POA: Diagnosis not present

## 2020-03-02 DIAGNOSIS — Z800 Family history of malignant neoplasm of digestive organs: Secondary | ICD-10-CM | POA: Diagnosis not present

## 2020-03-02 DIAGNOSIS — Z0000 Encounter for general adult medical examination without abnormal findings: Secondary | ICD-10-CM | POA: Diagnosis not present

## 2020-03-02 DIAGNOSIS — I10 Essential (primary) hypertension: Secondary | ICD-10-CM | POA: Diagnosis not present

## 2020-03-02 DIAGNOSIS — E785 Hyperlipidemia, unspecified: Secondary | ICD-10-CM | POA: Diagnosis not present

## 2020-03-02 DIAGNOSIS — J45909 Unspecified asthma, uncomplicated: Secondary | ICD-10-CM | POA: Diagnosis not present

## 2020-03-02 DIAGNOSIS — R82998 Other abnormal findings in urine: Secondary | ICD-10-CM | POA: Diagnosis not present

## 2020-03-02 DIAGNOSIS — Z8249 Family history of ischemic heart disease and other diseases of the circulatory system: Secondary | ICD-10-CM | POA: Diagnosis not present

## 2020-03-02 DIAGNOSIS — C73 Malignant neoplasm of thyroid gland: Secondary | ICD-10-CM | POA: Diagnosis not present

## 2020-03-02 DIAGNOSIS — G500 Trigeminal neuralgia: Secondary | ICD-10-CM | POA: Diagnosis not present

## 2020-03-09 DIAGNOSIS — Z1212 Encounter for screening for malignant neoplasm of rectum: Secondary | ICD-10-CM | POA: Diagnosis not present

## 2020-04-13 ENCOUNTER — Encounter: Payer: Self-pay | Admitting: Physician Assistant

## 2020-04-28 ENCOUNTER — Encounter: Payer: Self-pay | Admitting: Physician Assistant

## 2020-04-28 ENCOUNTER — Ambulatory Visit: Payer: Medicare HMO | Admitting: Physician Assistant

## 2020-04-28 VITALS — BP 106/74 | HR 60 | Ht 72.0 in | Wt 197.8 lb

## 2020-04-28 DIAGNOSIS — Z86010 Personal history of colonic polyps: Secondary | ICD-10-CM | POA: Diagnosis not present

## 2020-04-28 NOTE — Patient Instructions (Signed)
If you are age 74 or older, your body mass index should be between 23-30. Your Body mass index is 26.83 kg/m. If this is out of the aforementioned range listed, please consider follow up with your Primary Care Provider.  If you are age 44 or younger, your body mass index should be between 19-25. Your Body mass index is 26.83 kg/m. If this is out of the aformentioned range listed, please consider follow up with your Primary Care Provider.   You have been scheduled for a colonoscopy. Please follow written instructions given to you at your visit today.  Please pick up your prep supplies at the pharmacy within the next 1-3 days. If you use inhalers (even only as needed), please bring them with you on the day of your procedure.  Thank you for choosing me and [facility].  Ellouise Newer, PA-C

## 2020-04-28 NOTE — Progress Notes (Signed)
Chief Complaint: History of adenomatous polyps  HPI:    [name] is a 75 year old male with a past medical history of thyroid cancer and others listed below, who was referred to me by Jose Solian, MD for a complaint of history of adenomatous polyps    07/15/2009 colonoscopy report from Dr. Wynetta Gross shows sigmoid colonic diverticulosis, a 1 cm pedunculated polyp removed from the distal sigmoid colon, a 3 mm sessile polyp removed from the proximal sigmoid colon and a 3 mm sessile polyp removed from the cecum.  Pathology shows 1 adenomatous polyp removed from the cecum and sigmoid and a tubular adenoma removed in the sigmoid.  Repeat recommended in 5 years.    Today, the patient presents to clinic and tells me he is doing very well but he would just like to get caught up on his colon cancer screening.  He denies having any GI issues at all.    Denies fever, chills, weight loss, blood in his stool, abdominal pain or symptoms that awaken him from sleep.  Past Medical History:  Diagnosis Date  . Cancer [facility])    Thyroid cancer- tx. radioactive Iodine pill.  Marland Kitchen Hx of thyroid cancer    surgery and radioactive iodine. take supplemental thyroid.  Marland Kitchen Hypertension   . 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 BRAIN SURGERY     trigeminal nerve surgery-mild residual numbness left face.  . COLONOSCOPY WITH PROPOFOL N/A 01/11/2015   Procedure: COLONOSCOPY WITH PROPOFOL;  Surgeon: Jose Fair, MD;  Location: [facility];  Service: Endoscopy;  Laterality: N/A;  . TOTAL THYROIDECTOMY    . VASECTOMY      Current Outpatient Medications  Medication Sig Dispense Refill  . aspirin EC 81 MG tablet Take 81 mg by mouth daily.    . Calcium Citrate-Vitamin D (CALCIUM + D PO) Take 1 tablet by mouth daily.    Marland Kitchen levothyroxine (SYNTHROID, LEVOTHROID) 125 MCG tablet Take 125 mcg by mouth  daily before breakfast.    . Multiple Vitamin (MULTIVITAMIN WITH MINERALS) TABS tablet Take 1 tablet by mouth daily.    . ramipril (ALTACE) 5 MG capsule Take 5 mg by mouth daily.    . simvastatin (ZOCOR) 20 MG tablet Take 20 mg by mouth at bedtime.     No current facility-administered medications for this visit.    Allergies as of 04/28/2020 - Review Complete 04/28/2020  Allergen Reaction Noted  . Codeine  01/06/2013    Family History  Problem Relation Age of Onset  . Colon cancer Mother   . Liver disease Neg Hx   . Pancreatic cancer Neg Hx   . Esophageal cancer Neg Hx   . Stomach cancer Neg Hx     Social History   Socioeconomic History  . Marital status: Married    Spouse name: Not on file  . Number of children: Not on file  . Years of education: Not on file  . Highest education level: Not on file  Occupational History  . Not on file  Tobacco Use  . Smoking status: Former Smoker    Types: Cigarettes    Quit date: 01/07/1979    Years since quitting: 41.3  . Smokeless tobacco: Never Used  Vaping Use  . Vaping Use: Never used  Substance and Sexual Activity  . Alcohol use: Yes    Comment: occ.social  . Drug use: No  .  Sexual activity: Not Currently  Other Topics Concern  . Not on file  Social History Narrative  . Not on file   Social Determinants of Health   Financial Resource Strain: Not on file  Food Insecurity: Not on file  Transportation Needs: Not on file  Physical Activity: Not on file  Stress: Not on file  Social Connections: Not on file  Intimate Partner Violence: Not on file    Review of Systems:    Constitutional: No weight loss, fever or chills Skin: No rash  Cardiovascular: No chest pain Respiratory: No SOB  Gastrointestinal: See HPI and otherwise negative Genitourinary: No dysuria Neurological: No headache, dizziness or syncope Musculoskeletal: No new muscle or joint pain Hematologic: No bleeding  Psychiatric: No history of depression  or anxiety   Physical Exam:  Vital signs: BP 106/74   Pulse 60   Ht 6' (1.829 m)   Wt 197 lb 12.8 oz (89.7 kg)   SpO2 96%   BMI 26.83 kg/m  Constitutional:   Pleasant Caucasian male appears to be in NAD, Well developed, Well nourished, alert and cooperative Head:  Normocephalic and atraumatic. Eyes:   PEERL, EOMI. No icterus. Conjunctiva pink. Ears:  Normal auditory acuity. Neck:  Supple Throat: Oral cavity and pharynx without inflammation, swelling or lesion.  Respiratory: Respirations even and unlabored. Lungs clear to auscultation bilaterally.   No wheezes, crackles, or rhonchi.  Cardiovascular: Normal S1, S2. No MRG. Regular rate and rhythm. No peripheral edema, cyanosis or pallor.  Gastrointestinal:  Soft, nondistended, nontender. No rebound or guarding. Normal bowel sounds. No appreciable masses or hepatomegaly. Rectal:  Not performed.  Msk:  Symmetrical without gross deformities. Without edema, no deformity or joint abnormality.  Neurologic:  Alert and  oriented x4;  grossly normal neurologically.  Skin:   Dry and intact without significant lesions or rashes. Psychiatric: Demonstrates good judgement and reason without abnormal affect or behaviors.  No recent labs or imaging.  Assessment: 1.  History of adenomatous polyps: Last colonoscopy in 2011 with recommendations to repeat in 5 years  Plan: 1.  Scheduled patient for surveillance colonoscopy in the [employer].  Patient requested Dr. Tarri Gross as he has family members who also see her.  Patient was provided with a detailed list of risks for the procedure and he agrees to proceed. 2.  Patient to follow in clinic per recommendations from Dr. Tarri Gross after time of procedure.  Jose Newer, PA-C [facility] 04/28/2020, 9:11 AM  Cc: Jose Solian, MD

## 2020-04-29 NOTE — Progress Notes (Signed)
Reviewed and agree with management plans. ? ?Dawson Albers L. Quay Simkin, MD, MPH  ?

## 2020-04-30 DIAGNOSIS — I10 Essential (primary) hypertension: Secondary | ICD-10-CM | POA: Diagnosis not present

## 2020-06-23 DIAGNOSIS — D225 Melanocytic nevi of trunk: Secondary | ICD-10-CM | POA: Diagnosis not present

## 2020-06-23 DIAGNOSIS — D2262 Melanocytic nevi of left upper limb, including shoulder: Secondary | ICD-10-CM | POA: Diagnosis not present

## 2020-06-23 DIAGNOSIS — D2261 Melanocytic nevi of right upper limb, including shoulder: Secondary | ICD-10-CM | POA: Diagnosis not present

## 2020-06-23 DIAGNOSIS — L720 Epidermal cyst: Secondary | ICD-10-CM | POA: Diagnosis not present

## 2020-06-23 DIAGNOSIS — L821 Other seborrheic keratosis: Secondary | ICD-10-CM | POA: Diagnosis not present

## 2020-06-23 DIAGNOSIS — D1801 Hemangioma of skin and subcutaneous tissue: Secondary | ICD-10-CM | POA: Diagnosis not present

## 2020-06-23 DIAGNOSIS — L570 Actinic keratosis: Secondary | ICD-10-CM | POA: Diagnosis not present

## 2020-07-09 ENCOUNTER — Encounter: Payer: Self-pay | Admitting: Gastroenterology

## 2020-07-14 ENCOUNTER — Other Ambulatory Visit: Payer: Self-pay

## 2020-07-14 ENCOUNTER — Ambulatory Visit (AMBULATORY_SURGERY_CENTER): Payer: Medicare HMO | Admitting: Gastroenterology

## 2020-07-14 ENCOUNTER — Encounter: Payer: Self-pay | Admitting: Gastroenterology

## 2020-07-14 VITALS — BP 121/81 | HR 63 | Temp 97.8°F | Resp 15 | Ht 72.0 in | Wt 197.0 lb

## 2020-07-14 DIAGNOSIS — D125 Benign neoplasm of sigmoid colon: Secondary | ICD-10-CM

## 2020-07-14 DIAGNOSIS — D120 Benign neoplasm of cecum: Secondary | ICD-10-CM | POA: Diagnosis not present

## 2020-07-14 DIAGNOSIS — Z860101 Personal history of adenomatous and serrated colon polyps: Secondary | ICD-10-CM

## 2020-07-14 DIAGNOSIS — D124 Benign neoplasm of descending colon: Secondary | ICD-10-CM | POA: Diagnosis not present

## 2020-07-14 DIAGNOSIS — Z86010 Personal history of colonic polyps: Secondary | ICD-10-CM | POA: Diagnosis not present

## 2020-07-14 DIAGNOSIS — D122 Benign neoplasm of ascending colon: Secondary | ICD-10-CM

## 2020-07-14 DIAGNOSIS — D123 Benign neoplasm of transverse colon: Secondary | ICD-10-CM

## 2020-07-14 DIAGNOSIS — K635 Polyp of colon: Secondary | ICD-10-CM | POA: Diagnosis not present

## 2020-07-14 MED ORDER — SODIUM CHLORIDE 0.9 % IV SOLN: 500.0000 mL | Freq: Once | INTRAVENOUS | Status: DC

## 2020-07-14 NOTE — Progress Notes (Signed)
Called to room to assist during endoscopic procedure.  Patient ID and intended procedure confirmed with present staff. Received instructions for my participation in the procedure from the performing physician.  

## 2020-07-14 NOTE — Progress Notes (Signed)
PT taken to PACU. Monitors in place. VSS. Report given to RN. 

## 2020-07-14 NOTE — Patient Instructions (Signed)
YOU HAD AN ENDOSCOPIC PROCEDURE TODAY AT THE [facility]:   Refer to the procedure report that was given to you for any specific questions about what was found during the examination.  If the procedure report does not answer your questions, please call your gastroenterologist to clarify.  If you requested that your care partner not be given the details of your procedure findings, then the procedure report has been included in a sealed envelope for you to review at your convenience later.  YOU SHOULD EXPECT: Some feelings of bloating in the abdomen. Passage of more gas than usual.  Walking can help get rid of the air that was put into your GI tract during the procedure and reduce the bloating. If you had a lower endoscopy (such as a colonoscopy or flexible sigmoidoscopy) you may notice spotting of blood in your stool or on the toilet paper. If you underwent a bowel prep for your procedure, you may not have a normal bowel movement for a few days.  Please Note:  You might notice some irritation and congestion in your nose or some drainage.  This is from the oxygen used during your procedure.  There is no need for concern and it should clear up in a day or so.  SYMPTOMS TO REPORT IMMEDIATELY:  Following lower endoscopy (colonoscopy or flexible sigmoidoscopy):  Excessive amounts of blood in the stool  Significant tenderness or worsening of abdominal pains  Swelling of the abdomen that is new, acute  Fever of 100F or higher   For urgent or emergent issues, a gastroenterologist can be reached at any hour by calling [phone]. Do not use MyChart messaging for urgent concerns.    DIET:  We do recommend a small meal at first, but then you may proceed to your regular diet.  Drink plenty of fluids but you should avoid alcoholic beverages for 24 hours.  MEDICATIONS:  Continue present medications.  Please see handouts given to you by your recovery nurse.  Thank you for allowing us to  provide for your healthcare needs today.  ACTIVITY:  You should plan to take it easy for the rest of today and you should NOT DRIVE or use heavy machinery until tomorrow (because of the sedation medicines used during the test).    FOLLOW UP: Our staff will call the number listed on your records 48-72 hours following your procedure to check on you and address any questions or concerns that you may have regarding the information given to you following your procedure. If we do not reach you, we will leave a message.  We will attempt to reach you two times.  During this call, we will ask if you have developed any symptoms of COVID 19. If you develop any symptoms (ie: fever, flu-like symptoms, shortness of breath, cough etc.) before then, please call [phone].  If you test positive for Covid 19 in the 2 weeks post procedure, please call and report this information to us.    If any biopsies were taken you will be contacted by phone or by letter within the next 1-3 weeks.  Please call us at [phone] if you have not heard about the biopsies in 3 weeks.    SIGNATURES/CONFIDENTIALITY: You and/or your care partner have signed paperwork which will be entered into your electronic medical record.  These signatures attest to the fact that that the information above on your After Visit Summary has been reviewed and is understood.  Full responsibility of the   confidentiality of this discharge information lies with you and/or your care-partner.  

## 2020-07-14 NOTE — Op Note (Signed)
[place] Patient Name: Jose Gross Procedure Date: 07/14/2020 11:09 AM MRN: [id number] Endoscopist: Thornton Park MD, MD Age: 75 Referring MD:  Date of Birth: 01/30/1946 Gender: Male Account #: [id number] Procedure:                Colonoscopy Indications:              Surveillance: Personal history of adenomatous                            polyps on last colonoscopy > 5 years ago                           Personal history of adenima 2011 x 2 and 2016                           Mother with colon cancer in her 34s Medicines:                Monitored Anesthesia Care Procedure:                Pre-Anesthesia Assessment:                           - Prior to the procedure, a History and Physical                            was performed, and patient medications and                            allergies were reviewed. The patient's tolerance of                            previous anesthesia was also reviewed. The risks                            and benefits of the procedure and the sedation                            options and risks were discussed with the patient.                            All questions were answered, and informed consent                            was obtained. Prior Anticoagulants: The patient has                            taken no previous anticoagulant or antiplatelet                            agents. ASA Grade Assessment: II - A patient with                            mild systemic disease. After reviewing the risks  and benefits, the patient was deemed in                            satisfactory condition to undergo the procedure.                           After obtaining informed consent, the colonoscope                            was passed under direct vision. Throughout the                            procedure, the patient's blood pressure, pulse, and                            oxygen saturations were monitored  continuously. The                            Olympus CF-HQ190L [id number]) Colonoscope was                            introduced through the anus and advanced to the 3                            cm into the ileum. A second forward view of the                            right colon was performed. The colonoscopy was                            performed without difficulty. The patient tolerated                            the procedure well. The quality of the bowel                            preparation was adequate. The terminal ileum,                            ileocecal valve, appendiceal orifice, and rectum                            were photographed. Scope In: 11:16:14 AM Scope Out: 11:43:03 AM Scope Withdrawal Time: 0 hours 23 minutes 12 seconds  Total Procedure Duration: 0 hours 26 minutes 49 seconds  Findings:                 The perianal and digital rectal examinations were                            normal.                           Non-bleeding internal hemorrhoids were found.  Multiple small and large-mouthed diverticula were                            found in the sigmoid colon and descending colon.                           A 3 mm polyp was found in the descending colon and                            a 2 mm polyp was present in the distal sigmoid                            colon. The polyp was sessile. The polyp was removed                            with a cold snare. Resection and retrieval were                            complete. Estimated blood loss was minimal.                           Three sessile polyps were found in the descending                            colon and hepatic flexure. The polyps were 1 to 3                            mm in size. These polyps were removed with a cold                            snare except for the smallest polyp which required                            cold forceps given the location under a fold.                             Resection and retrieval were complete. Estimated                            blood loss was minimal.                           Six sessile polyps were found in the ascending                            colon. The polyps were 2 to 6 mm in size. These                            polyps were removed with a cold snare. Resection                            and retrieval were complete.  Estimated blood loss                            was minimal.                           A 10 mm polyp was found in the cecum. The polyp was                            flat. The polyp was removed with a cold snare.                            Resection and retrieval were complete. Estimated                            blood loss was minimal.                           The exam was otherwise without abnormality on                            direct and retroflexion views. Complications:            No immediate complications. Estimated blood loss:                            Minimal. Estimated Blood Loss:     Estimated blood loss was minimal. Impression:               - Non-bleeding internal hemorrhoids.                           - Diverticulosis in the sigmoid colon and in the                            descending colon.                           - Two small descending colon polps, removed with a                            cold snare. Resected and retrieved.                           - Three 1 to 3 mm polyps in the descending colon                            and at the hepatic flexure, removed with a cold                            snare and cold forceps. Resected and retrieved.                           - Six 2 to 6 mm polyps in the ascending colon,  removed with a cold snare. Resected and retrieved.                           - One 10 mm polyp in the cecum, removed with a cold                            snare. Resected and retrieved.                           - The examination was  otherwise normal on direct                            and retroflexion views. Recommendation:           - Patient has a contact number available for                            emergencies. The signs and symptoms of potential                            delayed complications were discussed with the                            patient. Return to normal activities tomorrow.                            Written discharge instructions were provided to the                            patient.                           - Resume previous diet.                           - Continue present medications.                           - Await pathology results.                           - Repeat colonoscopy date to be determined after                            pending pathology results are reviewed for                            surveillance. If at least 10 polyps are adenomas,                            repeat colonoscopy in 1 year. Otherwise, consider                            repeat colonoscopy in 3 years.                           -  Emerging evidence supports eating a diet of                            fruits, vegetables, grains, calcium, and yogurt                            while reducing red meat and alcohol may reduce the                            risk of colon cancer.                           - Thank you for allowing me to be involved in your                            colon cancer prevention. Thornton Park MD, MD 07/14/2020 11:51:56 AM This report has been signed electronically.

## 2020-07-14 NOTE — Progress Notes (Signed)
VS by CW. ?

## 2020-07-16 ENCOUNTER — Telehealth: Payer: Self-pay | Admitting: *Deleted

## 2020-07-16 NOTE — Telephone Encounter (Signed)
  Follow up Call-  Call back number 07/14/2020  Post procedure Call Back phone  # [phone]  Permission to leave phone message Yes  Some recent data might be hidden     Patient questions:  Do you have a fever, pain , or abdominal swelling? No. Pain Score  0 *  Have you tolerated food without any problems? Yes.    Have you been able to return to your normal activities? Yes.    Do you have any questions about your discharge instructions: Diet   No. Medications  No. Follow up visit  No.  Do you have questions or concerns about your Care? No.  Actions: * If pain score is 4 or above: No action needed, pain <4.

## 2020-08-02 ENCOUNTER — Encounter: Payer: Self-pay | Admitting: Gastroenterology

## 2020-08-03 DIAGNOSIS — E785 Hyperlipidemia, unspecified: Secondary | ICD-10-CM | POA: Diagnosis not present

## 2020-08-03 DIAGNOSIS — C73 Malignant neoplasm of thyroid gland: Secondary | ICD-10-CM | POA: Diagnosis not present

## 2020-08-03 DIAGNOSIS — I10 Essential (primary) hypertension: Secondary | ICD-10-CM | POA: Diagnosis not present

## 2020-08-03 DIAGNOSIS — G500 Trigeminal neuralgia: Secondary | ICD-10-CM | POA: Diagnosis not present

## 2020-08-03 DIAGNOSIS — Z8249 Family history of ischemic heart disease and other diseases of the circulatory system: Secondary | ICD-10-CM | POA: Diagnosis not present

## 2020-08-03 DIAGNOSIS — D126 Benign neoplasm of colon, unspecified: Secondary | ICD-10-CM | POA: Diagnosis not present

## 2020-08-03 DIAGNOSIS — Z800 Family history of malignant neoplasm of digestive organs: Secondary | ICD-10-CM | POA: Diagnosis not present

## 2020-08-03 DIAGNOSIS — J45909 Unspecified asthma, uncomplicated: Secondary | ICD-10-CM | POA: Diagnosis not present

## 2020-08-16 ENCOUNTER — Telehealth: Payer: Self-pay | Admitting: Gastroenterology

## 2020-08-16 NOTE — Telephone Encounter (Signed)
Inbound call from patient wife, Hilda Blades. States she would like to talk about the results of procedure 5/25. Best contact number [phone]

## 2020-08-16 NOTE — Telephone Encounter (Signed)
Returned call to patient. Informed patient that letter was mailed to home. Letter read to patient over phone. Patient verbalized understanding and nothing further needed at this time.

## 2020-08-18 DIAGNOSIS — I10 Essential (primary) hypertension: Secondary | ICD-10-CM | POA: Diagnosis not present

## 2020-08-18 DIAGNOSIS — E785 Hyperlipidemia, unspecified: Secondary | ICD-10-CM | POA: Diagnosis not present

## 2020-08-18 DIAGNOSIS — Z008 Encounter for other general examination: Secondary | ICD-10-CM | POA: Diagnosis not present

## 2020-08-18 DIAGNOSIS — Z8249 Family history of ischemic heart disease and other diseases of the circulatory system: Secondary | ICD-10-CM | POA: Diagnosis not present

## 2020-08-18 DIAGNOSIS — M1990 Unspecified osteoarthritis, unspecified site: Secondary | ICD-10-CM | POA: Diagnosis not present

## 2020-08-18 DIAGNOSIS — Z7982 Long term (current) use of aspirin: Secondary | ICD-10-CM | POA: Diagnosis not present

## 2020-08-18 DIAGNOSIS — E890 Postprocedural hypothyroidism: Secondary | ICD-10-CM | POA: Diagnosis not present

## 2020-08-18 DIAGNOSIS — Z85850 Personal history of malignant neoplasm of thyroid: Secondary | ICD-10-CM | POA: Diagnosis not present

## 2020-08-18 DIAGNOSIS — Z87891 Personal history of nicotine dependence: Secondary | ICD-10-CM | POA: Diagnosis not present

## 2020-08-18 DIAGNOSIS — Z833 Family history of diabetes mellitus: Secondary | ICD-10-CM | POA: Diagnosis not present

## 2020-08-18 DIAGNOSIS — Z809 Family history of malignant neoplasm, unspecified: Secondary | ICD-10-CM | POA: Diagnosis not present

## 2020-09-14 DIAGNOSIS — I10 Essential (primary) hypertension: Secondary | ICD-10-CM | POA: Diagnosis not present

## 2020-09-15 DIAGNOSIS — H25813 Combined forms of age-related cataract, bilateral: Secondary | ICD-10-CM | POA: Diagnosis not present

## 2020-09-15 DIAGNOSIS — H5203 Hypermetropia, bilateral: Secondary | ICD-10-CM | POA: Diagnosis not present

## 2020-12-15 DIAGNOSIS — I10 Essential (primary) hypertension: Secondary | ICD-10-CM | POA: Diagnosis not present

## 2021-03-17 DIAGNOSIS — Z125 Encounter for screening for malignant neoplasm of prostate: Secondary | ICD-10-CM | POA: Diagnosis not present

## 2021-03-17 DIAGNOSIS — R739 Hyperglycemia, unspecified: Secondary | ICD-10-CM | POA: Diagnosis not present

## 2021-03-17 DIAGNOSIS — E785 Hyperlipidemia, unspecified: Secondary | ICD-10-CM | POA: Diagnosis not present

## 2021-03-17 DIAGNOSIS — I10 Essential (primary) hypertension: Secondary | ICD-10-CM | POA: Diagnosis not present

## 2021-03-24 DIAGNOSIS — I10 Essential (primary) hypertension: Secondary | ICD-10-CM | POA: Diagnosis not present

## 2021-03-24 DIAGNOSIS — G500 Trigeminal neuralgia: Secondary | ICD-10-CM | POA: Diagnosis not present

## 2021-03-24 DIAGNOSIS — Z800 Family history of malignant neoplasm of digestive organs: Secondary | ICD-10-CM | POA: Diagnosis not present

## 2021-03-24 DIAGNOSIS — Z8249 Family history of ischemic heart disease and other diseases of the circulatory system: Secondary | ICD-10-CM | POA: Diagnosis not present

## 2021-03-24 DIAGNOSIS — Z1212 Encounter for screening for malignant neoplasm of rectum: Secondary | ICD-10-CM | POA: Diagnosis not present

## 2021-03-24 DIAGNOSIS — C73 Malignant neoplasm of thyroid gland: Secondary | ICD-10-CM | POA: Diagnosis not present

## 2021-03-24 DIAGNOSIS — N1831 Chronic kidney disease, stage 3a: Secondary | ICD-10-CM | POA: Diagnosis not present

## 2021-03-24 DIAGNOSIS — E785 Hyperlipidemia, unspecified: Secondary | ICD-10-CM | POA: Diagnosis not present

## 2021-03-24 DIAGNOSIS — I129 Hypertensive chronic kidney disease with stage 1 through stage 4 chronic kidney disease, or unspecified chronic kidney disease: Secondary | ICD-10-CM | POA: Diagnosis not present

## 2021-03-24 DIAGNOSIS — R82998 Other abnormal findings in urine: Secondary | ICD-10-CM | POA: Diagnosis not present

## 2021-03-24 DIAGNOSIS — D126 Benign neoplasm of colon, unspecified: Secondary | ICD-10-CM | POA: Diagnosis not present

## 2021-03-24 DIAGNOSIS — J45909 Unspecified asthma, uncomplicated: Secondary | ICD-10-CM | POA: Diagnosis not present

## 2021-03-24 DIAGNOSIS — Z0000 Encounter for general adult medical examination without abnormal findings: Secondary | ICD-10-CM | POA: Diagnosis not present

## 2021-05-12 DIAGNOSIS — I10 Essential (primary) hypertension: Secondary | ICD-10-CM | POA: Diagnosis not present

## 2021-07-15 DIAGNOSIS — D225 Melanocytic nevi of trunk: Secondary | ICD-10-CM | POA: Diagnosis not present

## 2021-07-15 DIAGNOSIS — L821 Other seborrheic keratosis: Secondary | ICD-10-CM | POA: Diagnosis not present

## 2021-07-15 DIAGNOSIS — D1801 Hemangioma of skin and subcutaneous tissue: Secondary | ICD-10-CM | POA: Diagnosis not present

## 2021-07-15 DIAGNOSIS — L570 Actinic keratosis: Secondary | ICD-10-CM | POA: Diagnosis not present

## 2021-07-25 DIAGNOSIS — E785 Hyperlipidemia, unspecified: Secondary | ICD-10-CM | POA: Diagnosis not present

## 2021-07-25 DIAGNOSIS — Z85850 Personal history of malignant neoplasm of thyroid: Secondary | ICD-10-CM | POA: Diagnosis not present

## 2021-07-25 DIAGNOSIS — Z800 Family history of malignant neoplasm of digestive organs: Secondary | ICD-10-CM | POA: Diagnosis not present

## 2021-07-25 DIAGNOSIS — D126 Benign neoplasm of colon, unspecified: Secondary | ICD-10-CM | POA: Diagnosis not present

## 2021-07-25 DIAGNOSIS — G500 Trigeminal neuralgia: Secondary | ICD-10-CM | POA: Diagnosis not present

## 2021-07-25 DIAGNOSIS — Z8249 Family history of ischemic heart disease and other diseases of the circulatory system: Secondary | ICD-10-CM | POA: Diagnosis not present

## 2021-07-25 DIAGNOSIS — N1831 Chronic kidney disease, stage 3a: Secondary | ICD-10-CM | POA: Diagnosis not present

## 2021-07-25 DIAGNOSIS — I129 Hypertensive chronic kidney disease with stage 1 through stage 4 chronic kidney disease, or unspecified chronic kidney disease: Secondary | ICD-10-CM | POA: Diagnosis not present

## 2021-07-25 DIAGNOSIS — J45909 Unspecified asthma, uncomplicated: Secondary | ICD-10-CM | POA: Diagnosis not present

## 2021-09-30 DIAGNOSIS — I129 Hypertensive chronic kidney disease with stage 1 through stage 4 chronic kidney disease, or unspecified chronic kidney disease: Secondary | ICD-10-CM | POA: Diagnosis not present

## 2021-09-30 DIAGNOSIS — N1831 Chronic kidney disease, stage 3a: Secondary | ICD-10-CM | POA: Diagnosis not present

## 2021-10-31 DIAGNOSIS — N1831 Chronic kidney disease, stage 3a: Secondary | ICD-10-CM | POA: Diagnosis not present

## 2021-10-31 DIAGNOSIS — I129 Hypertensive chronic kidney disease with stage 1 through stage 4 chronic kidney disease, or unspecified chronic kidney disease: Secondary | ICD-10-CM | POA: Diagnosis not present

## 2021-11-17 DIAGNOSIS — Z87891 Personal history of nicotine dependence: Secondary | ICD-10-CM | POA: Diagnosis not present

## 2021-11-17 DIAGNOSIS — K219 Gastro-esophageal reflux disease without esophagitis: Secondary | ICD-10-CM | POA: Diagnosis not present

## 2021-11-17 DIAGNOSIS — Z85850 Personal history of malignant neoplasm of thyroid: Secondary | ICD-10-CM | POA: Diagnosis not present

## 2021-11-17 DIAGNOSIS — Z8249 Family history of ischemic heart disease and other diseases of the circulatory system: Secondary | ICD-10-CM | POA: Diagnosis not present

## 2021-11-17 DIAGNOSIS — I10 Essential (primary) hypertension: Secondary | ICD-10-CM | POA: Diagnosis not present

## 2021-11-17 DIAGNOSIS — Z809 Family history of malignant neoplasm, unspecified: Secondary | ICD-10-CM | POA: Diagnosis not present

## 2021-11-17 DIAGNOSIS — E890 Postprocedural hypothyroidism: Secondary | ICD-10-CM | POA: Diagnosis not present

## 2021-11-17 DIAGNOSIS — Z833 Family history of diabetes mellitus: Secondary | ICD-10-CM | POA: Diagnosis not present

## 2021-11-17 DIAGNOSIS — Z7982 Long term (current) use of aspirin: Secondary | ICD-10-CM | POA: Diagnosis not present

## 2021-11-17 DIAGNOSIS — E785 Hyperlipidemia, unspecified: Secondary | ICD-10-CM | POA: Diagnosis not present

## 2021-11-17 DIAGNOSIS — J45909 Unspecified asthma, uncomplicated: Secondary | ICD-10-CM | POA: Diagnosis not present

## 2021-11-17 DIAGNOSIS — Z87892 Personal history of anaphylaxis: Secondary | ICD-10-CM | POA: Diagnosis not present

## 2021-11-30 DIAGNOSIS — D126 Benign neoplasm of colon, unspecified: Secondary | ICD-10-CM | POA: Diagnosis not present

## 2021-11-30 DIAGNOSIS — Z85850 Personal history of malignant neoplasm of thyroid: Secondary | ICD-10-CM | POA: Diagnosis not present

## 2021-11-30 DIAGNOSIS — H524 Presbyopia: Secondary | ICD-10-CM | POA: Diagnosis not present

## 2021-11-30 DIAGNOSIS — E785 Hyperlipidemia, unspecified: Secondary | ICD-10-CM | POA: Diagnosis not present

## 2021-11-30 DIAGNOSIS — N1831 Chronic kidney disease, stage 3a: Secondary | ICD-10-CM | POA: Diagnosis not present

## 2021-11-30 DIAGNOSIS — I129 Hypertensive chronic kidney disease with stage 1 through stage 4 chronic kidney disease, or unspecified chronic kidney disease: Secondary | ICD-10-CM | POA: Diagnosis not present

## 2021-11-30 DIAGNOSIS — J45909 Unspecified asthma, uncomplicated: Secondary | ICD-10-CM | POA: Diagnosis not present

## 2021-11-30 DIAGNOSIS — Z8249 Family history of ischemic heart disease and other diseases of the circulatory system: Secondary | ICD-10-CM | POA: Diagnosis not present

## 2021-11-30 DIAGNOSIS — G500 Trigeminal neuralgia: Secondary | ICD-10-CM | POA: Diagnosis not present

## 2021-11-30 DIAGNOSIS — H52223 Regular astigmatism, bilateral: Secondary | ICD-10-CM | POA: Diagnosis not present

## 2021-11-30 DIAGNOSIS — Z800 Family history of malignant neoplasm of digestive organs: Secondary | ICD-10-CM | POA: Diagnosis not present

## 2022-03-26 DIAGNOSIS — R0789 Other chest pain: Secondary | ICD-10-CM | POA: Diagnosis not present

## 2022-04-07 DIAGNOSIS — E785 Hyperlipidemia, unspecified: Secondary | ICD-10-CM | POA: Diagnosis not present

## 2022-04-07 DIAGNOSIS — R739 Hyperglycemia, unspecified: Secondary | ICD-10-CM | POA: Diagnosis not present

## 2022-04-07 DIAGNOSIS — Z85850 Personal history of malignant neoplasm of thyroid: Secondary | ICD-10-CM | POA: Diagnosis not present

## 2022-04-07 DIAGNOSIS — Z1212 Encounter for screening for malignant neoplasm of rectum: Secondary | ICD-10-CM | POA: Diagnosis not present

## 2022-04-07 DIAGNOSIS — I10 Essential (primary) hypertension: Secondary | ICD-10-CM | POA: Diagnosis not present

## 2022-04-07 DIAGNOSIS — Z125 Encounter for screening for malignant neoplasm of prostate: Secondary | ICD-10-CM | POA: Diagnosis not present

## 2022-04-14 ENCOUNTER — Other Ambulatory Visit (HOSPITAL_BASED_OUTPATIENT_CLINIC_OR_DEPARTMENT_OTHER): Payer: Self-pay | Admitting: Internal Medicine

## 2022-04-14 DIAGNOSIS — I129 Hypertensive chronic kidney disease with stage 1 through stage 4 chronic kidney disease, or unspecified chronic kidney disease: Secondary | ICD-10-CM | POA: Diagnosis not present

## 2022-04-14 DIAGNOSIS — G500 Trigeminal neuralgia: Secondary | ICD-10-CM | POA: Diagnosis not present

## 2022-04-14 DIAGNOSIS — N1831 Chronic kidney disease, stage 3a: Secondary | ICD-10-CM | POA: Diagnosis not present

## 2022-04-14 DIAGNOSIS — R7401 Elevation of levels of liver transaminase levels: Secondary | ICD-10-CM

## 2022-04-14 DIAGNOSIS — Z800 Family history of malignant neoplasm of digestive organs: Secondary | ICD-10-CM | POA: Diagnosis not present

## 2022-04-14 DIAGNOSIS — D126 Benign neoplasm of colon, unspecified: Secondary | ICD-10-CM | POA: Diagnosis not present

## 2022-04-14 DIAGNOSIS — Z8249 Family history of ischemic heart disease and other diseases of the circulatory system: Secondary | ICD-10-CM | POA: Diagnosis not present

## 2022-04-14 DIAGNOSIS — Z85850 Personal history of malignant neoplasm of thyroid: Secondary | ICD-10-CM | POA: Diagnosis not present

## 2022-04-14 DIAGNOSIS — R82998 Other abnormal findings in urine: Secondary | ICD-10-CM | POA: Diagnosis not present

## 2022-04-14 DIAGNOSIS — J45909 Unspecified asthma, uncomplicated: Secondary | ICD-10-CM | POA: Diagnosis not present

## 2022-04-14 DIAGNOSIS — Z23 Encounter for immunization: Secondary | ICD-10-CM | POA: Diagnosis not present

## 2022-04-14 DIAGNOSIS — Z0000 Encounter for general adult medical examination without abnormal findings: Secondary | ICD-10-CM | POA: Diagnosis not present

## 2022-04-14 DIAGNOSIS — E785 Hyperlipidemia, unspecified: Secondary | ICD-10-CM | POA: Diagnosis not present

## 2022-04-21 ENCOUNTER — Ambulatory Visit (HOSPITAL_BASED_OUTPATIENT_CLINIC_OR_DEPARTMENT_OTHER)
Admission: RE | Admit: 2022-04-21 | Discharge: 2022-04-21 | Disposition: A | Discharge status: Home / Self Care | Payer: Medicare HMO | Source: Ambulatory Visit | Attending: Internal Medicine | Admitting: Internal Medicine

## 2022-04-21 DIAGNOSIS — R7401 Elevation of levels of liver transaminase levels: Secondary | ICD-10-CM

## 2022-04-21 DIAGNOSIS — R945 Abnormal results of liver function studies: Secondary | ICD-10-CM | POA: Diagnosis not present

## 2022-05-31 DIAGNOSIS — K219 Gastro-esophageal reflux disease without esophagitis: Secondary | ICD-10-CM | POA: Diagnosis not present

## 2022-05-31 DIAGNOSIS — E785 Hyperlipidemia, unspecified: Secondary | ICD-10-CM | POA: Diagnosis not present

## 2022-05-31 DIAGNOSIS — K5900 Constipation, unspecified: Secondary | ICD-10-CM | POA: Diagnosis not present

## 2022-05-31 DIAGNOSIS — I10 Essential (primary) hypertension: Secondary | ICD-10-CM | POA: Diagnosis not present

## 2022-05-31 DIAGNOSIS — R748 Abnormal levels of other serum enzymes: Secondary | ICD-10-CM | POA: Diagnosis not present

## 2022-10-02 DIAGNOSIS — L821 Other seborrheic keratosis: Secondary | ICD-10-CM | POA: Diagnosis not present

## 2022-10-02 DIAGNOSIS — D2262 Melanocytic nevi of left upper limb, including shoulder: Secondary | ICD-10-CM | POA: Diagnosis not present

## 2022-10-02 DIAGNOSIS — L814 Other melanin hyperpigmentation: Secondary | ICD-10-CM | POA: Diagnosis not present

## 2022-10-02 DIAGNOSIS — D220 Melanocytic nevi of lip: Secondary | ICD-10-CM | POA: Diagnosis not present

## 2022-10-02 DIAGNOSIS — D225 Melanocytic nevi of trunk: Secondary | ICD-10-CM | POA: Diagnosis not present

## 2022-10-02 DIAGNOSIS — L309 Dermatitis, unspecified: Secondary | ICD-10-CM | POA: Diagnosis not present

## 2022-10-03 DIAGNOSIS — N1831 Chronic kidney disease, stage 3a: Secondary | ICD-10-CM | POA: Diagnosis not present

## 2022-10-03 DIAGNOSIS — Z85850 Personal history of malignant neoplasm of thyroid: Secondary | ICD-10-CM | POA: Diagnosis not present

## 2022-10-03 DIAGNOSIS — J45909 Unspecified asthma, uncomplicated: Secondary | ICD-10-CM | POA: Diagnosis not present

## 2022-10-03 DIAGNOSIS — M25569 Pain in unspecified knee: Secondary | ICD-10-CM | POA: Diagnosis not present

## 2022-10-03 DIAGNOSIS — G500 Trigeminal neuralgia: Secondary | ICD-10-CM | POA: Diagnosis not present

## 2022-10-03 DIAGNOSIS — I129 Hypertensive chronic kidney disease with stage 1 through stage 4 chronic kidney disease, or unspecified chronic kidney disease: Secondary | ICD-10-CM | POA: Diagnosis not present

## 2022-10-03 DIAGNOSIS — E785 Hyperlipidemia, unspecified: Secondary | ICD-10-CM | POA: Diagnosis not present

## 2022-10-03 DIAGNOSIS — Z800 Family history of malignant neoplasm of digestive organs: Secondary | ICD-10-CM | POA: Diagnosis not present

## 2022-10-03 DIAGNOSIS — D126 Benign neoplasm of colon, unspecified: Secondary | ICD-10-CM | POA: Diagnosis not present

## 2022-10-03 DIAGNOSIS — R7401 Elevation of levels of liver transaminase levels: Secondary | ICD-10-CM | POA: Diagnosis not present

## 2022-10-03 DIAGNOSIS — Z8249 Family history of ischemic heart disease and other diseases of the circulatory system: Secondary | ICD-10-CM | POA: Diagnosis not present

## 2022-11-30 DIAGNOSIS — H5203 Hypermetropia, bilateral: Secondary | ICD-10-CM | POA: Diagnosis not present

## 2023-01-03 DIAGNOSIS — Z85850 Personal history of malignant neoplasm of thyroid: Secondary | ICD-10-CM | POA: Diagnosis not present

## 2023-01-03 DIAGNOSIS — Z87891 Personal history of nicotine dependence: Secondary | ICD-10-CM | POA: Diagnosis not present

## 2023-01-03 DIAGNOSIS — E785 Hyperlipidemia, unspecified: Secondary | ICD-10-CM | POA: Diagnosis not present

## 2023-01-03 DIAGNOSIS — K219 Gastro-esophageal reflux disease without esophagitis: Secondary | ICD-10-CM | POA: Diagnosis not present

## 2023-01-03 DIAGNOSIS — Z7982 Long term (current) use of aspirin: Secondary | ICD-10-CM | POA: Diagnosis not present

## 2023-01-03 DIAGNOSIS — Z8249 Family history of ischemic heart disease and other diseases of the circulatory system: Secondary | ICD-10-CM | POA: Diagnosis not present

## 2023-01-03 DIAGNOSIS — E890 Postprocedural hypothyroidism: Secondary | ICD-10-CM | POA: Diagnosis not present

## 2023-01-03 DIAGNOSIS — Z809 Family history of malignant neoplasm, unspecified: Secondary | ICD-10-CM | POA: Diagnosis not present

## 2023-01-03 DIAGNOSIS — Z833 Family history of diabetes mellitus: Secondary | ICD-10-CM | POA: Diagnosis not present

## 2023-01-03 DIAGNOSIS — Z79890 Hormone replacement therapy (postmenopausal): Secondary | ICD-10-CM | POA: Diagnosis not present

## 2023-01-03 DIAGNOSIS — G500 Trigeminal neuralgia: Secondary | ICD-10-CM | POA: Diagnosis not present

## 2023-01-03 DIAGNOSIS — I10 Essential (primary) hypertension: Secondary | ICD-10-CM | POA: Diagnosis not present

## 2023-05-09 DIAGNOSIS — E039 Hypothyroidism, unspecified: Secondary | ICD-10-CM | POA: Diagnosis not present

## 2023-05-09 DIAGNOSIS — I129 Hypertensive chronic kidney disease with stage 1 through stage 4 chronic kidney disease, or unspecified chronic kidney disease: Secondary | ICD-10-CM | POA: Diagnosis not present

## 2023-05-09 DIAGNOSIS — E785 Hyperlipidemia, unspecified: Secondary | ICD-10-CM | POA: Diagnosis not present

## 2023-05-09 DIAGNOSIS — Z125 Encounter for screening for malignant neoplasm of prostate: Secondary | ICD-10-CM | POA: Diagnosis not present

## 2023-05-09 DIAGNOSIS — N1831 Chronic kidney disease, stage 3a: Secondary | ICD-10-CM | POA: Diagnosis not present

## 2023-05-09 DIAGNOSIS — Z1212 Encounter for screening for malignant neoplasm of rectum: Secondary | ICD-10-CM | POA: Diagnosis not present

## 2023-05-09 DIAGNOSIS — R739 Hyperglycemia, unspecified: Secondary | ICD-10-CM | POA: Diagnosis not present

## 2023-05-14 DIAGNOSIS — Z7689 Persons encountering health services in other specified circumstances: Secondary | ICD-10-CM | POA: Diagnosis not present

## 2023-05-15 DIAGNOSIS — E039 Hypothyroidism, unspecified: Secondary | ICD-10-CM | POA: Diagnosis not present

## 2023-05-15 DIAGNOSIS — E785 Hyperlipidemia, unspecified: Secondary | ICD-10-CM | POA: Diagnosis not present

## 2023-05-15 DIAGNOSIS — Z1212 Encounter for screening for malignant neoplasm of rectum: Secondary | ICD-10-CM | POA: Diagnosis not present

## 2023-05-16 DIAGNOSIS — J45909 Unspecified asthma, uncomplicated: Secondary | ICD-10-CM | POA: Diagnosis not present

## 2023-05-16 DIAGNOSIS — N1831 Chronic kidney disease, stage 3a: Secondary | ICD-10-CM | POA: Diagnosis not present

## 2023-05-16 DIAGNOSIS — R82998 Other abnormal findings in urine: Secondary | ICD-10-CM | POA: Diagnosis not present

## 2023-05-16 DIAGNOSIS — Z8249 Family history of ischemic heart disease and other diseases of the circulatory system: Secondary | ICD-10-CM | POA: Diagnosis not present

## 2023-05-16 DIAGNOSIS — Z1331 Encounter for screening for depression: Secondary | ICD-10-CM | POA: Diagnosis not present

## 2023-05-16 DIAGNOSIS — Z800 Family history of malignant neoplasm of digestive organs: Secondary | ICD-10-CM | POA: Diagnosis not present

## 2023-05-16 DIAGNOSIS — D126 Benign neoplasm of colon, unspecified: Secondary | ICD-10-CM | POA: Diagnosis not present

## 2023-05-16 DIAGNOSIS — Z85850 Personal history of malignant neoplasm of thyroid: Secondary | ICD-10-CM | POA: Diagnosis not present

## 2023-05-16 DIAGNOSIS — G500 Trigeminal neuralgia: Secondary | ICD-10-CM | POA: Diagnosis not present

## 2023-05-16 DIAGNOSIS — I129 Hypertensive chronic kidney disease with stage 1 through stage 4 chronic kidney disease, or unspecified chronic kidney disease: Secondary | ICD-10-CM | POA: Diagnosis not present

## 2023-05-16 DIAGNOSIS — E785 Hyperlipidemia, unspecified: Secondary | ICD-10-CM | POA: Diagnosis not present

## 2023-05-16 DIAGNOSIS — Z1339 Encounter for screening examination for other mental health and behavioral disorders: Secondary | ICD-10-CM | POA: Diagnosis not present

## 2023-05-16 DIAGNOSIS — Z0000 Encounter for general adult medical examination without abnormal findings: Secondary | ICD-10-CM | POA: Diagnosis not present

## 2023-06-11 ENCOUNTER — Encounter: Payer: Self-pay | Admitting: Gastroenterology

## 2023-07-12 ENCOUNTER — Ambulatory Visit (AMBULATORY_SURGERY_CENTER)

## 2023-07-12 ENCOUNTER — Encounter: Payer: Self-pay | Admitting: Gastroenterology

## 2023-07-12 VITALS — Ht 72.0 in | Wt 180.0 lb

## 2023-07-12 DIAGNOSIS — Z860100 Personal history of colon polyps, unspecified: Secondary | ICD-10-CM

## 2023-07-12 MED ORDER — NA SULFATE-K SULFATE-MG SULF 17.5-3.13-1.6 GM/177ML PO SOLN: 1.0000 | Freq: Once | ORAL | 0 refills | Status: AC

## 2023-07-12 NOTE — Progress Notes (Signed)

## 2023-07-26 ENCOUNTER — Ambulatory Visit: Admitting: Gastroenterology

## 2023-07-26 ENCOUNTER — Encounter: Payer: Self-pay | Admitting: Gastroenterology

## 2023-07-26 VITALS — BP 125/74 | HR 76 | Temp 97.9°F | Resp 12 | Ht 72.0 in | Wt 180.0 lb

## 2023-07-26 DIAGNOSIS — D120 Benign neoplasm of cecum: Secondary | ICD-10-CM

## 2023-07-26 DIAGNOSIS — Z860100 Personal history of colon polyps, unspecified: Secondary | ICD-10-CM

## 2023-07-26 DIAGNOSIS — D122 Benign neoplasm of ascending colon: Secondary | ICD-10-CM

## 2023-07-26 DIAGNOSIS — D123 Benign neoplasm of transverse colon: Secondary | ICD-10-CM | POA: Diagnosis not present

## 2023-07-26 DIAGNOSIS — K5730 Diverticulosis of large intestine without perforation or abscess without bleeding: Secondary | ICD-10-CM | POA: Diagnosis not present

## 2023-07-26 DIAGNOSIS — Z1211 Encounter for screening for malignant neoplasm of colon: Secondary | ICD-10-CM | POA: Diagnosis not present

## 2023-07-26 DIAGNOSIS — I10 Essential (primary) hypertension: Secondary | ICD-10-CM | POA: Diagnosis not present

## 2023-07-26 MED ORDER — SODIUM CHLORIDE 0.9 % IV SOLN
500.0000 mL | Freq: Once | INTRAVENOUS | Status: DC
Start: 2023-07-26 — End: 2023-07-26

## 2023-07-26 NOTE — Progress Notes (Signed)
 Sedate, gd SR, tolerated procedure well, VSS, report to RN

## 2023-07-26 NOTE — Op Note (Signed)
 [facility] Patient Name: Jose Gross Procedure Date: 07/26/2023 10:24 AM MRN: [id number] Endoscopist: Geralyn Knee E. Cherryl Corona , MD, [id number] Age: 78 Referring MD:  Date of Birth: Apr 16, 1945 Gender: Male Account #: [id number] Procedure:                Colonoscopy Indications:              Surveillance: Personal history of adenomatous                            polyps on last colonoscopy 3 years ago, High risk                            colon cancer surveillance: Personal history of                            multiple (3 or more) adenomas, Last colonoscopy:                            May 2022 Medicines:                Monitored Anesthesia Care Procedure:                Pre-Anesthesia Assessment:                           - Prior to the procedure, a History and Physical                            was performed, and patient medications and                            allergies were reviewed. The patient's tolerance of                            previous anesthesia was also reviewed. The risks                            and benefits of the procedure and the sedation                            options and risks were discussed with the patient.                            All questions were answered, and informed consent                            was obtained. Prior Anticoagulants: The patient has                            taken no anticoagulant or antiplatelet agents. ASA                            Grade Assessment: II - A patient with mild systemic  disease. After reviewing the risks and benefits,                            the patient was deemed in satisfactory condition to                            undergo the procedure.                           After obtaining informed consent, the colonoscope                            was passed under direct vision. Throughout the                            procedure, the patient's blood pressure, pulse, and                             oxygen saturations were monitored continuously. The                            Olympus Scope SN: [id number] was introduced through                            the anus and advanced to the the cecum, identified                            by appendiceal orifice and ileocecal valve. The                            colonoscopy was performed without difficulty. The                            patient tolerated the procedure well. The quality                            of the bowel preparation was adequate. The                            ileocecal valve, appendiceal orifice, and rectum                            were photographed. The bowel preparation used was                            SUPREP via split dose instruction. Scope In: 10:36:49 AM Scope Out: 11:04:41 AM Scope Withdrawal Time: 0 hours 23 minutes 5 seconds  Total Procedure Duration: 0 hours 27 minutes 52 seconds  Findings:                 The perianal and digital rectal examinations were                            normal. Pertinent negatives include normal  sphincter tone and no palpable rectal lesions.                           A 2 mm polyp was found in the cecum. The polyp was                            sessile. The polyp was removed with a cold snare.                            Resection and retrieval were complete. Estimated                            blood loss was minimal.                           Nine sessile polyps were found in the ascending                            colon. The polyps were 2 to 8 mm in size. These                            polyps were removed with a cold snare. Resection                            and retrieval were complete. Estimated blood loss                            was minimal.                           A 3 mm polyp was found in the hepatic flexure. The                            polyp was sessile. The polyp was removed with a                             cold snare. Resection and retrieval were complete.                            Estimated blood loss was minimal.                           Many large-mouthed and small-mouthed diverticula                            were found in the sigmoid colon and descending                            colon. There was narrowing of the colon in                            association with the diverticular opening.  The exam was otherwise normal throughout the                            examined colon.                           The retroflexed view of the distal rectum and anal                            verge was normal and showed no anal or rectal                            abnormalities. Complications:            No immediate complications. Estimated Blood Loss:     Estimated blood loss was minimal. Impression:               - One 2 mm polyp in the cecum, removed with a cold                            snare. Resected and retrieved.                           - Nine 2 to 8 mm polyps in the ascending colon,                            removed with a cold snare. Resected and retrieved.                           - One 3 mm polyp at the hepatic flexure, removed                            with a cold snare. Resected and retrieved.                           - Severe diverticulosis in the sigmoid colon and in                            the descending colon. There was narrowing of the                            colon in association with the diverticular opening.                           - The distal rectum and anal verge are normal on                            retroflexion view. Recommendation:           - Patient has a contact number available for                            emergencies. The signs and symptoms of potential  delayed complications were discussed with the                            patient. Return to normal activities tomorrow.                             Written discharge instructions were provided to the                            patient.                           - Resume previous diet.                           - Continue present medications.                           - Await pathology results.                           - Repeat colonoscopy (date not yet determined) for                            surveillance based on pathology results.                           - Recommend high fiber diet or daily fiber                            supplement to reduce risk of diverticular                            complications. Rosario Duey E. Cherryl Corona, MD 07/26/2023 11:12:04 AM This report has been signed electronically.

## 2023-07-26 NOTE — Patient Instructions (Signed)

## 2023-07-26 NOTE — Progress Notes (Signed)
  Gastroenterology History and Physical   Primary Care Physician:  Avva, Ravisankar, MD   Reason for Procedure:   Colon cancer screening/history of polyps  Plan:    Surveillance colonoscopy     HPI: Jose Gross is a 78 y.o. male undergoing surveillance colonoscopy.  He has no chronic GI symptoms.   His mother was diagnosed with colon cancer in her 80s.  His last colonoscopy was in 2022 and 11 polyps were removed, at least 7 were adenomatous, including a 10 mm cecal adenoma.   Past Medical History:  Diagnosis Date   Cancer (HCC)    Thyroid  cancer- tx. radioactive Iodine pill.   Hx of thyroid  cancer    surgery and radioactive iodine. take supplemental thyroid .   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   COLONOSCOPY WITH PROPOFOL  N/A 01/11/2015   Procedure: COLONOSCOPY WITH PROPOFOL ;  Surgeon: Garrett Kallman, MD;  Location: [facility];  Service: Endoscopy;  Laterality: N/A;   TOTAL THYROIDECTOMY     VASECTOMY      Prior to Admission medications   Medication Sig Start Date End Date Taking? Authorizing Provider  ascorbic acid (VITAMIN C) 500 MG tablet Take 500 mg by mouth daily.   Yes [provider]  aspirin EC 81 MG tablet Take 81 mg by mouth daily.   Yes [provider]  Calcium Citrate-Vitamin D (CALCIUM + D PO) Take 1 tablet by mouth daily.   Yes [provider]  levothyroxine (SYNTHROID, LEVOTHROID) 125 MCG tablet Take 125 mcg by mouth daily before breakfast.   Yes [provider]  Multiple Vitamin (MULTIVITAMIN WITH MINERALS) TABS tablet Take 1 tablet by mouth daily.   Yes [provider]  simvastatin (ZOCOR) 20 MG tablet Take 20 mg by mouth at bedtime.   Yes [provider]  telmisartan (MICARDIS)  80 MG tablet Take 80 mg by mouth daily.   Yes [provider]  albuterol (VENTOLIN HFA) 108 (90 Base) MCG/ACT inhaler Inhale 2 puffs into the lungs every 6 (six) hours as needed. Patient not taking: Reported on 07/26/2023    [provider]  diphenhydrAMINE (BENADRYL ALLERGY) 25 MG tablet Take 25 mg by mouth at bedtime as needed. Patient not taking: Reported on 07/26/2023    [provider]  famotidine (PEPCID) 20 MG tablet Take 20 mg by mouth as needed. Patient not taking: Reported on 07/26/2023    [provider]    Current Outpatient Medications  Medication Sig Dispense Refill   ascorbic acid (VITAMIN C) 500 MG tablet Take 500 mg by mouth daily.     aspirin EC 81 MG tablet Take 81 mg by mouth daily.     Calcium Citrate-Vitamin D (CALCIUM + D PO) Take 1 tablet by mouth daily.     levothyroxine (SYNTHROID, LEVOTHROID) 125 MCG tablet Take 125 mcg by mouth daily before breakfast.     Multiple Vitamin (MULTIVITAMIN WITH MINERALS) TABS tablet Take 1 tablet by mouth daily.     simvastatin (ZOCOR) 20 MG tablet Take 20 mg by mouth at bedtime.     telmisartan (MICARDIS) 80 MG tablet Take 80 mg by mouth daily.     albuterol (VENTOLIN HFA) 108 (90 Base) MCG/ACT inhaler Inhale 2 puffs into the lungs every 6 (six) hours as  needed. (Patient not taking: Reported on 07/26/2023)     diphenhydrAMINE (BENADRYL ALLERGY) 25 MG tablet Take 25 mg by mouth at bedtime as needed. (Patient not taking: Reported on 07/26/2023)     famotidine (PEPCID) 20 MG tablet Take 20 mg by mouth as needed. (Patient not taking: Reported on 07/26/2023)     Current Facility-Administered Medications  Medication Dose Route Frequency Provider Last Rate Last Admin   0.9 %  sodium chloride  infusion  500 mL Intravenous Once Lindle Rhea, MD       0.9 %  sodium chloride  infusion  500 mL Intravenous Once Elois Hair, MD        Allergies as of 07/26/2023 - Review Complete 07/26/2023  Allergen Reaction  Noted   Codeine Nausea And Vomiting 01/06/2013    Family History  Problem Relation Age of Onset   Colon cancer Mother    Liver disease Neg Hx    Pancreatic cancer Neg Hx    Esophageal cancer Neg Hx    Stomach cancer Neg Hx     Social History   Socioeconomic History   Marital status: Married    Spouse name: Not on file   Number of children: Not on file   Years of education: Not on file   Highest education level: Not on file  Occupational History   Not on file  Tobacco Use   Smoking status: Former    Current packs/day: 0.00    Types: Cigarettes    Quit date: 01/07/1979    Years since quitting: 44.5   Smokeless tobacco: Never  Vaping Use   Vaping status: Never Used  Substance and Sexual Activity   Alcohol  use: Yes    Comment: occ.social   Drug use: No   Sexual activity: Not Currently  Other Topics Concern   Not on file  Social History Narrative   Not on file   Social Drivers of Health   Financial Resource Strain: Not on file  Food Insecurity: Not on file  Transportation Needs: Not on file  Physical Activity: Not on file  Stress: Not on file  Social Connections: Not on file  Intimate Partner Violence: Not on file    Review of Systems:  All other review of systems negative except as mentioned in the HPI.  Physical Exam: Vital signs BP 129/81   Pulse 70   Temp 97.9 F (36.6 C)   Ht 6' (1.829 m)   Wt 180 lb (81.6 kg)   SpO2 95%   BMI 24.41 kg/m   General:   Alert,  Well-developed, well-nourished, pleasant and cooperative in NAD Airway:  Mallampati 3 Lungs:  Clear throughout to auscultation.   Heart:  Regular rate and rhythm; no murmurs, clicks, rubs,  or gallops. Abdomen:  Soft, nontender and nondistended. Normal bowel sounds.   Neuro/Psych:  Normal mood and affect. A and O x 3   Kasey Ewings E. Cherryl Corona, MD [facility]

## 2023-07-26 NOTE — Progress Notes (Signed)
 Called to room to assist during endoscopic procedure.  Patient ID and intended procedure confirmed with present staff. Received instructions for my participation in the procedure from the performing physician.

## 2023-07-27 ENCOUNTER — Telehealth: Payer: Self-pay

## 2023-07-27 NOTE — Telephone Encounter (Signed)
  Follow up Call-     07/26/2023    9:55 AM  Call back number  Post procedure Call Back phone  # [phone]  Permission to leave phone message Yes     Patient questions:  Do you have a fever, pain , or abdominal swelling? No. Pain Score  0 *  Have you tolerated food without any problems? Yes.    Have you been able to return to your normal activities? Yes.    Do you have any questions about your discharge instructions: Diet   No. Medications  No. Follow up visit  No.  Do you have questions or concerns about your Care? No.  Actions: * If pain score is 4 or above: No action needed, pain <4.

## 2023-08-01 LAB — SURGICAL PATHOLOGY

## 2023-08-02 ENCOUNTER — Ambulatory Visit: Payer: Self-pay | Admitting: Gastroenterology

## 2023-08-02 NOTE — Progress Notes (Signed)
 Mr. Brodhead,   All eleven polyps that I removed during your recent procedure were completely benign but were proven to be pre-cancerous polyps that MAY have grown into cancers if they had not been removed.  Studies shows that at least 20% of women over age 77 and 30% of men over age 57 have pre-cancerous polyps.  Based on current nationally recognized surveillance guidelines, I recommend that you have a repeat colonoscopy in 1 year.   If you develop any new rectal bleeding, abdominal pain or significant bowel habit changes, please contact me before then.

## 2023-08-23 NOTE — Telephone Encounter (Signed)
 I have spoken to patient to relay Dr London response regarding colonoscopy pathology results. Patient said he was not able to see those results in his MyChart account. He denies any additional questions following our conversation.

## 2023-08-23 NOTE — Telephone Encounter (Signed)
 Inbound call from patient spouse requesting f/u call to patient cell at [phone] in regards to results. Please advise.   Thank you

## 2023-11-05 DIAGNOSIS — E785 Hyperlipidemia, unspecified: Secondary | ICD-10-CM | POA: Diagnosis not present

## 2023-11-05 DIAGNOSIS — I129 Hypertensive chronic kidney disease with stage 1 through stage 4 chronic kidney disease, or unspecified chronic kidney disease: Secondary | ICD-10-CM | POA: Diagnosis not present

## 2023-11-05 DIAGNOSIS — Z85850 Personal history of malignant neoplasm of thyroid: Secondary | ICD-10-CM | POA: Diagnosis not present

## 2023-12-06 DIAGNOSIS — H2513 Age-related nuclear cataract, bilateral: Secondary | ICD-10-CM | POA: Diagnosis not present

## 2023-12-06 DIAGNOSIS — H524 Presbyopia: Secondary | ICD-10-CM | POA: Diagnosis not present

## 2023-12-06 DIAGNOSIS — H52223 Regular astigmatism, bilateral: Secondary | ICD-10-CM | POA: Diagnosis not present

## 2023-12-06 DIAGNOSIS — H5203 Hypermetropia, bilateral: Secondary | ICD-10-CM | POA: Diagnosis not present

## 2023-12-06 DIAGNOSIS — H53002 Unspecified amblyopia, left eye: Secondary | ICD-10-CM | POA: Diagnosis not present

## 2024-01-08 DIAGNOSIS — D2262 Melanocytic nevi of left upper limb, including shoulder: Secondary | ICD-10-CM | POA: Diagnosis not present

## 2024-01-08 DIAGNOSIS — L821 Other seborrheic keratosis: Secondary | ICD-10-CM | POA: Diagnosis not present

## 2024-01-08 DIAGNOSIS — L570 Actinic keratosis: Secondary | ICD-10-CM | POA: Diagnosis not present

## 2024-01-08 DIAGNOSIS — D1801 Hemangioma of skin and subcutaneous tissue: Secondary | ICD-10-CM | POA: Diagnosis not present

## 2024-01-08 DIAGNOSIS — D2372 Other benign neoplasm of skin of left lower limb, including hip: Secondary | ICD-10-CM | POA: Diagnosis not present

## 2024-01-08 DIAGNOSIS — L853 Xerosis cutis: Secondary | ICD-10-CM | POA: Diagnosis not present
